# Patient Record
Sex: Female | Born: 1940
Health system: Southern US, Community
[De-identification: ages and names within clinical notes are randomized; demographics above are authoritative.]

## PROBLEM LIST (undated history)

## (undated) DIAGNOSIS — R0609 Other forms of dyspnea: Secondary | ICD-10-CM

## (undated) DIAGNOSIS — M5136 Other intervertebral disc degeneration, lumbar region: Secondary | ICD-10-CM

## (undated) DIAGNOSIS — I10 Essential (primary) hypertension: Secondary | ICD-10-CM

## (undated) DIAGNOSIS — M810 Age-related osteoporosis without current pathological fracture: Secondary | ICD-10-CM

## (undated) DIAGNOSIS — K222 Esophageal obstruction: Secondary | ICD-10-CM

## (undated) DIAGNOSIS — J309 Allergic rhinitis, unspecified: Secondary | ICD-10-CM

## (undated) DIAGNOSIS — I4891 Unspecified atrial fibrillation: Secondary | ICD-10-CM

## (undated) DIAGNOSIS — M1990 Unspecified osteoarthritis, unspecified site: Secondary | ICD-10-CM

## (undated) DIAGNOSIS — R7303 Prediabetes: Secondary | ICD-10-CM

## (undated) DIAGNOSIS — Z950 Presence of cardiac pacemaker: Secondary | ICD-10-CM

## (undated) DIAGNOSIS — K219 Gastro-esophageal reflux disease without esophagitis: Secondary | ICD-10-CM

## (undated) DIAGNOSIS — M51369 Other intervertebral disc degeneration, lumbar region without mention of lumbar back pain or lower extremity pain: Secondary | ICD-10-CM

## (undated) DIAGNOSIS — E7800 Pure hypercholesterolemia, unspecified: Secondary | ICD-10-CM

## (undated) HISTORY — DX: Other intervertebral disc degeneration, lumbar region: M51.36

## (undated) HISTORY — DX: Unspecified atrial fibrillation: I48.91

## (undated) HISTORY — DX: Prediabetes: R73.03

## (undated) HISTORY — DX: Allergic rhinitis, unspecified: J30.9

## (undated) HISTORY — DX: Other intervertebral disc degeneration, lumbar region without mention of lumbar back pain or lower extremity pain: M51.369

## (undated) HISTORY — DX: Esophageal obstruction: K22.2

## (undated) HISTORY — DX: Presence of cardiac pacemaker: Z95.0

## (undated) HISTORY — PX: BREAST BIOPSY: SHX20

## (undated) HISTORY — DX: Essential (primary) hypertension: I10

## (undated) HISTORY — DX: Pure hypercholesterolemia, unspecified: E78.00

## (undated) HISTORY — DX: Other forms of dyspnea: R06.09

## (undated) HISTORY — DX: Unspecified osteoarthritis, unspecified site: M19.90

## (undated) HISTORY — DX: Age-related osteoporosis without current pathological fracture: M81.0

## (undated) HISTORY — PX: INSERT / REPLACE / REMOVE PACEMAKER: SUR710

## (undated) HISTORY — DX: Gastro-esophageal reflux disease without esophagitis: K21.9

---

## 2014-10-06 DIAGNOSIS — I10 Essential (primary) hypertension: Secondary | ICD-10-CM | POA: Diagnosis not present

## 2014-10-06 DIAGNOSIS — E780 Pure hypercholesterolemia: Secondary | ICD-10-CM | POA: Diagnosis not present

## 2014-10-15 DIAGNOSIS — R55 Syncope and collapse: Secondary | ICD-10-CM | POA: Diagnosis not present

## 2014-10-15 DIAGNOSIS — E785 Hyperlipidemia, unspecified: Secondary | ICD-10-CM | POA: Diagnosis not present

## 2014-10-15 DIAGNOSIS — I10 Essential (primary) hypertension: Secondary | ICD-10-CM | POA: Diagnosis not present

## 2014-10-15 DIAGNOSIS — I455 Other specified heart block: Secondary | ICD-10-CM | POA: Diagnosis not present

## 2014-10-15 DIAGNOSIS — I495 Sick sinus syndrome: Secondary | ICD-10-CM | POA: Diagnosis not present

## 2014-10-16 DIAGNOSIS — I480 Paroxysmal atrial fibrillation: Secondary | ICD-10-CM | POA: Diagnosis not present

## 2014-10-16 DIAGNOSIS — Z0000 Encounter for general adult medical examination without abnormal findings: Secondary | ICD-10-CM | POA: Diagnosis not present

## 2014-10-16 DIAGNOSIS — K219 Gastro-esophageal reflux disease without esophagitis: Secondary | ICD-10-CM | POA: Diagnosis not present

## 2014-10-16 DIAGNOSIS — Z1239 Encounter for other screening for malignant neoplasm of breast: Secondary | ICD-10-CM | POA: Diagnosis not present

## 2014-10-16 DIAGNOSIS — E780 Pure hypercholesterolemia: Secondary | ICD-10-CM | POA: Diagnosis not present

## 2014-10-16 DIAGNOSIS — I495 Sick sinus syndrome: Secondary | ICD-10-CM | POA: Diagnosis not present

## 2014-10-16 DIAGNOSIS — N6019 Diffuse cystic mastopathy of unspecified breast: Secondary | ICD-10-CM | POA: Diagnosis not present

## 2014-10-16 DIAGNOSIS — I10 Essential (primary) hypertension: Secondary | ICD-10-CM | POA: Diagnosis not present

## 2014-10-16 DIAGNOSIS — M7542 Impingement syndrome of left shoulder: Secondary | ICD-10-CM | POA: Diagnosis not present

## 2014-10-27 DIAGNOSIS — M7542 Impingement syndrome of left shoulder: Secondary | ICD-10-CM | POA: Diagnosis not present

## 2014-10-27 DIAGNOSIS — M25512 Pain in left shoulder: Secondary | ICD-10-CM | POA: Diagnosis not present

## 2014-10-30 DIAGNOSIS — M25512 Pain in left shoulder: Secondary | ICD-10-CM | POA: Diagnosis not present

## 2014-10-30 DIAGNOSIS — M7542 Impingement syndrome of left shoulder: Secondary | ICD-10-CM | POA: Diagnosis not present

## 2014-11-03 DIAGNOSIS — M25512 Pain in left shoulder: Secondary | ICD-10-CM | POA: Diagnosis not present

## 2014-11-03 DIAGNOSIS — M7542 Impingement syndrome of left shoulder: Secondary | ICD-10-CM | POA: Diagnosis not present

## 2014-11-06 DIAGNOSIS — M25512 Pain in left shoulder: Secondary | ICD-10-CM | POA: Diagnosis not present

## 2014-11-06 DIAGNOSIS — M7542 Impingement syndrome of left shoulder: Secondary | ICD-10-CM | POA: Diagnosis not present

## 2014-11-11 DIAGNOSIS — M7542 Impingement syndrome of left shoulder: Secondary | ICD-10-CM | POA: Diagnosis not present

## 2014-11-11 DIAGNOSIS — M25512 Pain in left shoulder: Secondary | ICD-10-CM | POA: Diagnosis not present

## 2014-11-18 DIAGNOSIS — M7542 Impingement syndrome of left shoulder: Secondary | ICD-10-CM | POA: Diagnosis not present

## 2014-11-18 DIAGNOSIS — M25512 Pain in left shoulder: Secondary | ICD-10-CM | POA: Diagnosis not present

## 2014-11-20 DIAGNOSIS — M25512 Pain in left shoulder: Secondary | ICD-10-CM | POA: Diagnosis not present

## 2014-11-20 DIAGNOSIS — M7542 Impingement syndrome of left shoulder: Secondary | ICD-10-CM | POA: Diagnosis not present

## 2014-11-25 DIAGNOSIS — M7542 Impingement syndrome of left shoulder: Secondary | ICD-10-CM | POA: Diagnosis not present

## 2014-11-25 DIAGNOSIS — M25512 Pain in left shoulder: Secondary | ICD-10-CM | POA: Diagnosis not present

## 2014-11-27 DIAGNOSIS — M25512 Pain in left shoulder: Secondary | ICD-10-CM | POA: Diagnosis not present

## 2014-11-27 DIAGNOSIS — M7542 Impingement syndrome of left shoulder: Secondary | ICD-10-CM | POA: Diagnosis not present

## 2014-12-02 DIAGNOSIS — M7542 Impingement syndrome of left shoulder: Secondary | ICD-10-CM | POA: Diagnosis not present

## 2014-12-02 DIAGNOSIS — M25512 Pain in left shoulder: Secondary | ICD-10-CM | POA: Diagnosis not present

## 2014-12-03 DIAGNOSIS — I495 Sick sinus syndrome: Secondary | ICD-10-CM | POA: Diagnosis not present

## 2014-12-03 DIAGNOSIS — I10 Essential (primary) hypertension: Secondary | ICD-10-CM | POA: Diagnosis not present

## 2014-12-03 DIAGNOSIS — R55 Syncope and collapse: Secondary | ICD-10-CM | POA: Diagnosis not present

## 2014-12-03 DIAGNOSIS — I455 Other specified heart block: Secondary | ICD-10-CM | POA: Diagnosis not present

## 2014-12-04 DIAGNOSIS — M7542 Impingement syndrome of left shoulder: Secondary | ICD-10-CM | POA: Diagnosis not present

## 2014-12-04 DIAGNOSIS — M25512 Pain in left shoulder: Secondary | ICD-10-CM | POA: Diagnosis not present

## 2014-12-09 DIAGNOSIS — M25512 Pain in left shoulder: Secondary | ICD-10-CM | POA: Diagnosis not present

## 2014-12-09 DIAGNOSIS — M7542 Impingement syndrome of left shoulder: Secondary | ICD-10-CM | POA: Diagnosis not present

## 2014-12-12 DIAGNOSIS — M7542 Impingement syndrome of left shoulder: Secondary | ICD-10-CM | POA: Diagnosis not present

## 2014-12-12 DIAGNOSIS — M25512 Pain in left shoulder: Secondary | ICD-10-CM | POA: Diagnosis not present

## 2014-12-15 DIAGNOSIS — Z1231 Encounter for screening mammogram for malignant neoplasm of breast: Secondary | ICD-10-CM | POA: Diagnosis not present

## 2014-12-16 DIAGNOSIS — M7542 Impingement syndrome of left shoulder: Secondary | ICD-10-CM | POA: Diagnosis not present

## 2014-12-16 DIAGNOSIS — M25512 Pain in left shoulder: Secondary | ICD-10-CM | POA: Diagnosis not present

## 2014-12-18 DIAGNOSIS — M25512 Pain in left shoulder: Secondary | ICD-10-CM | POA: Diagnosis not present

## 2014-12-18 DIAGNOSIS — M7542 Impingement syndrome of left shoulder: Secondary | ICD-10-CM | POA: Diagnosis not present

## 2014-12-23 DIAGNOSIS — M7542 Impingement syndrome of left shoulder: Secondary | ICD-10-CM | POA: Diagnosis not present

## 2014-12-23 DIAGNOSIS — M25512 Pain in left shoulder: Secondary | ICD-10-CM | POA: Diagnosis not present

## 2014-12-25 DIAGNOSIS — M7542 Impingement syndrome of left shoulder: Secondary | ICD-10-CM | POA: Diagnosis not present

## 2014-12-25 DIAGNOSIS — M25512 Pain in left shoulder: Secondary | ICD-10-CM | POA: Diagnosis not present

## 2015-01-01 DIAGNOSIS — M7542 Impingement syndrome of left shoulder: Secondary | ICD-10-CM | POA: Diagnosis not present

## 2015-01-01 DIAGNOSIS — M25512 Pain in left shoulder: Secondary | ICD-10-CM | POA: Diagnosis not present

## 2015-01-07 DIAGNOSIS — M7542 Impingement syndrome of left shoulder: Secondary | ICD-10-CM | POA: Diagnosis not present

## 2015-01-07 DIAGNOSIS — M25512 Pain in left shoulder: Secondary | ICD-10-CM | POA: Diagnosis not present

## 2015-01-08 DIAGNOSIS — M7542 Impingement syndrome of left shoulder: Secondary | ICD-10-CM | POA: Diagnosis not present

## 2015-01-08 DIAGNOSIS — M25512 Pain in left shoulder: Secondary | ICD-10-CM | POA: Diagnosis not present

## 2015-01-13 DIAGNOSIS — M7542 Impingement syndrome of left shoulder: Secondary | ICD-10-CM | POA: Diagnosis not present

## 2015-01-13 DIAGNOSIS — M25512 Pain in left shoulder: Secondary | ICD-10-CM | POA: Diagnosis not present

## 2015-01-15 DIAGNOSIS — M7542 Impingement syndrome of left shoulder: Secondary | ICD-10-CM | POA: Diagnosis not present

## 2015-01-15 DIAGNOSIS — M25512 Pain in left shoulder: Secondary | ICD-10-CM | POA: Diagnosis not present

## 2015-02-12 DIAGNOSIS — H26491 Other secondary cataract, right eye: Secondary | ICD-10-CM | POA: Diagnosis not present

## 2015-02-12 DIAGNOSIS — Z961 Presence of intraocular lens: Secondary | ICD-10-CM | POA: Diagnosis not present

## 2015-02-12 DIAGNOSIS — H40051 Ocular hypertension, right eye: Secondary | ICD-10-CM | POA: Diagnosis not present

## 2015-04-15 DIAGNOSIS — R55 Syncope and collapse: Secondary | ICD-10-CM | POA: Diagnosis not present

## 2015-04-15 DIAGNOSIS — I495 Sick sinus syndrome: Secondary | ICD-10-CM | POA: Diagnosis not present

## 2015-04-15 DIAGNOSIS — E785 Hyperlipidemia, unspecified: Secondary | ICD-10-CM | POA: Diagnosis not present

## 2015-04-15 DIAGNOSIS — I441 Atrioventricular block, second degree: Secondary | ICD-10-CM | POA: Diagnosis not present

## 2015-04-15 DIAGNOSIS — E119 Type 2 diabetes mellitus without complications: Secondary | ICD-10-CM | POA: Diagnosis not present

## 2015-05-23 DIAGNOSIS — Z23 Encounter for immunization: Secondary | ICD-10-CM | POA: Diagnosis not present

## 2015-06-03 DIAGNOSIS — I10 Essential (primary) hypertension: Secondary | ICD-10-CM | POA: Diagnosis not present

## 2015-06-03 DIAGNOSIS — E785 Hyperlipidemia, unspecified: Secondary | ICD-10-CM | POA: Diagnosis not present

## 2015-08-19 DIAGNOSIS — Z961 Presence of intraocular lens: Secondary | ICD-10-CM | POA: Diagnosis not present

## 2015-08-19 DIAGNOSIS — H40051 Ocular hypertension, right eye: Secondary | ICD-10-CM | POA: Diagnosis not present

## 2015-08-19 DIAGNOSIS — H26491 Other secondary cataract, right eye: Secondary | ICD-10-CM | POA: Diagnosis not present

## 2015-10-19 DIAGNOSIS — R5383 Other fatigue: Secondary | ICD-10-CM | POA: Diagnosis not present

## 2015-10-19 DIAGNOSIS — I480 Paroxysmal atrial fibrillation: Secondary | ICD-10-CM | POA: Diagnosis not present

## 2015-10-19 DIAGNOSIS — I10 Essential (primary) hypertension: Secondary | ICD-10-CM | POA: Diagnosis not present

## 2015-10-19 DIAGNOSIS — M791 Myalgia: Secondary | ICD-10-CM | POA: Diagnosis not present

## 2015-10-19 DIAGNOSIS — Z1239 Encounter for other screening for malignant neoplasm of breast: Secondary | ICD-10-CM | POA: Diagnosis not present

## 2015-10-19 DIAGNOSIS — Z0000 Encounter for general adult medical examination without abnormal findings: Secondary | ICD-10-CM | POA: Diagnosis not present

## 2015-10-19 DIAGNOSIS — Z23 Encounter for immunization: Secondary | ICD-10-CM | POA: Diagnosis not present

## 2015-10-19 DIAGNOSIS — I495 Sick sinus syndrome: Secondary | ICD-10-CM | POA: Diagnosis not present

## 2015-10-19 DIAGNOSIS — E7800 Pure hypercholesterolemia, unspecified: Secondary | ICD-10-CM | POA: Diagnosis not present

## 2015-10-19 DIAGNOSIS — H6123 Impacted cerumen, bilateral: Secondary | ICD-10-CM | POA: Diagnosis not present

## 2015-12-02 DIAGNOSIS — R030 Elevated blood-pressure reading, without diagnosis of hypertension: Secondary | ICD-10-CM | POA: Diagnosis not present

## 2015-12-02 DIAGNOSIS — I495 Sick sinus syndrome: Secondary | ICD-10-CM | POA: Diagnosis not present

## 2015-12-02 DIAGNOSIS — E785 Hyperlipidemia, unspecified: Secondary | ICD-10-CM | POA: Diagnosis not present

## 2015-12-03 DIAGNOSIS — E784 Other hyperlipidemia: Secondary | ICD-10-CM | POA: Diagnosis not present

## 2015-12-03 DIAGNOSIS — R030 Elevated blood-pressure reading, without diagnosis of hypertension: Secondary | ICD-10-CM | POA: Diagnosis not present

## 2015-12-21 DIAGNOSIS — Z1231 Encounter for screening mammogram for malignant neoplasm of breast: Secondary | ICD-10-CM | POA: Diagnosis not present

## 2015-12-23 DIAGNOSIS — I495 Sick sinus syndrome: Secondary | ICD-10-CM | POA: Diagnosis not present

## 2015-12-23 DIAGNOSIS — R030 Elevated blood-pressure reading, without diagnosis of hypertension: Secondary | ICD-10-CM | POA: Diagnosis not present

## 2015-12-23 DIAGNOSIS — I441 Atrioventricular block, second degree: Secondary | ICD-10-CM | POA: Diagnosis not present

## 2015-12-23 DIAGNOSIS — R55 Syncope and collapse: Secondary | ICD-10-CM | POA: Diagnosis not present

## 2015-12-23 DIAGNOSIS — E785 Hyperlipidemia, unspecified: Secondary | ICD-10-CM | POA: Diagnosis not present

## 2015-12-23 DIAGNOSIS — I4891 Unspecified atrial fibrillation: Secondary | ICD-10-CM | POA: Diagnosis not present

## 2016-02-29 DIAGNOSIS — E785 Hyperlipidemia, unspecified: Secondary | ICD-10-CM | POA: Diagnosis not present

## 2016-02-29 DIAGNOSIS — R55 Syncope and collapse: Secondary | ICD-10-CM | POA: Diagnosis not present

## 2016-02-29 DIAGNOSIS — I495 Sick sinus syndrome: Secondary | ICD-10-CM | POA: Diagnosis not present

## 2016-02-29 DIAGNOSIS — R030 Elevated blood-pressure reading, without diagnosis of hypertension: Secondary | ICD-10-CM | POA: Diagnosis not present

## 2016-02-29 DIAGNOSIS — I441 Atrioventricular block, second degree: Secondary | ICD-10-CM | POA: Diagnosis not present

## 2016-03-03 DIAGNOSIS — Z961 Presence of intraocular lens: Secondary | ICD-10-CM | POA: Diagnosis not present

## 2016-03-03 DIAGNOSIS — H26491 Other secondary cataract, right eye: Secondary | ICD-10-CM | POA: Diagnosis not present

## 2016-03-03 DIAGNOSIS — H40051 Ocular hypertension, right eye: Secondary | ICD-10-CM | POA: Diagnosis not present

## 2016-03-23 DIAGNOSIS — R9431 Abnormal electrocardiogram [ECG] [EKG]: Secondary | ICD-10-CM | POA: Diagnosis not present

## 2016-03-23 DIAGNOSIS — R938 Abnormal findings on diagnostic imaging of other specified body structures: Secondary | ICD-10-CM | POA: Diagnosis not present

## 2016-03-23 DIAGNOSIS — R0602 Shortness of breath: Secondary | ICD-10-CM | POA: Diagnosis not present

## 2016-03-25 DIAGNOSIS — R9431 Abnormal electrocardiogram [ECG] [EKG]: Secondary | ICD-10-CM | POA: Diagnosis not present

## 2016-03-25 DIAGNOSIS — Z6825 Body mass index (BMI) 25.0-25.9, adult: Secondary | ICD-10-CM | POA: Diagnosis not present

## 2016-03-25 DIAGNOSIS — M26609 Unspecified temporomandibular joint disorder, unspecified side: Secondary | ICD-10-CM | POA: Diagnosis not present

## 2016-03-25 DIAGNOSIS — I10 Essential (primary) hypertension: Secondary | ICD-10-CM | POA: Diagnosis not present

## 2016-03-30 DIAGNOSIS — I495 Sick sinus syndrome: Secondary | ICD-10-CM | POA: Diagnosis not present

## 2016-03-30 DIAGNOSIS — R030 Elevated blood-pressure reading, without diagnosis of hypertension: Secondary | ICD-10-CM | POA: Diagnosis not present

## 2016-03-30 DIAGNOSIS — R55 Syncope and collapse: Secondary | ICD-10-CM | POA: Diagnosis not present

## 2016-03-30 DIAGNOSIS — E785 Hyperlipidemia, unspecified: Secondary | ICD-10-CM | POA: Diagnosis not present

## 2016-03-30 DIAGNOSIS — I441 Atrioventricular block, second degree: Secondary | ICD-10-CM | POA: Diagnosis not present

## 2016-05-10 DIAGNOSIS — R030 Elevated blood-pressure reading, without diagnosis of hypertension: Secondary | ICD-10-CM | POA: Diagnosis not present

## 2016-05-10 DIAGNOSIS — R55 Syncope and collapse: Secondary | ICD-10-CM | POA: Diagnosis not present

## 2016-05-10 DIAGNOSIS — I441 Atrioventricular block, second degree: Secondary | ICD-10-CM | POA: Diagnosis not present

## 2016-05-10 DIAGNOSIS — I495 Sick sinus syndrome: Secondary | ICD-10-CM | POA: Diagnosis not present

## 2016-05-19 DIAGNOSIS — I4891 Unspecified atrial fibrillation: Secondary | ICD-10-CM | POA: Diagnosis not present

## 2016-05-19 DIAGNOSIS — E785 Hyperlipidemia, unspecified: Secondary | ICD-10-CM | POA: Diagnosis not present

## 2016-05-19 DIAGNOSIS — Z950 Presence of cardiac pacemaker: Secondary | ICD-10-CM | POA: Diagnosis not present

## 2016-05-19 DIAGNOSIS — I441 Atrioventricular block, second degree: Secondary | ICD-10-CM | POA: Diagnosis not present

## 2016-05-19 DIAGNOSIS — I2510 Atherosclerotic heart disease of native coronary artery without angina pectoris: Secondary | ICD-10-CM | POA: Diagnosis not present

## 2016-05-19 DIAGNOSIS — I10 Essential (primary) hypertension: Secondary | ICD-10-CM | POA: Diagnosis not present

## 2016-05-26 DIAGNOSIS — E785 Hyperlipidemia, unspecified: Secondary | ICD-10-CM | POA: Diagnosis not present

## 2016-05-26 DIAGNOSIS — R030 Elevated blood-pressure reading, without diagnosis of hypertension: Secondary | ICD-10-CM | POA: Diagnosis not present

## 2016-05-26 DIAGNOSIS — R0600 Dyspnea, unspecified: Secondary | ICD-10-CM | POA: Diagnosis not present

## 2016-05-26 DIAGNOSIS — I495 Sick sinus syndrome: Secondary | ICD-10-CM | POA: Diagnosis not present

## 2016-05-26 DIAGNOSIS — I480 Paroxysmal atrial fibrillation: Secondary | ICD-10-CM | POA: Diagnosis not present

## 2016-05-26 DIAGNOSIS — I441 Atrioventricular block, second degree: Secondary | ICD-10-CM | POA: Diagnosis not present

## 2016-06-01 DIAGNOSIS — I2510 Atherosclerotic heart disease of native coronary artery without angina pectoris: Secondary | ICD-10-CM | POA: Diagnosis not present

## 2016-06-01 DIAGNOSIS — R030 Elevated blood-pressure reading, without diagnosis of hypertension: Secondary | ICD-10-CM | POA: Diagnosis not present

## 2016-06-01 DIAGNOSIS — I4891 Unspecified atrial fibrillation: Secondary | ICD-10-CM | POA: Diagnosis not present

## 2016-06-06 DIAGNOSIS — Z23 Encounter for immunization: Secondary | ICD-10-CM | POA: Diagnosis not present

## 2016-08-17 DIAGNOSIS — H40003 Preglaucoma, unspecified, bilateral: Secondary | ICD-10-CM | POA: Diagnosis not present

## 2016-09-28 DIAGNOSIS — M25562 Pain in left knee: Secondary | ICD-10-CM | POA: Diagnosis not present

## 2016-10-05 DIAGNOSIS — H40003 Preglaucoma, unspecified, bilateral: Secondary | ICD-10-CM | POA: Diagnosis not present

## 2016-10-14 ENCOUNTER — Ambulatory Visit (INDEPENDENT_AMBULATORY_CARE_PROVIDER_SITE_OTHER): Payer: Medicare Other | Admitting: Internal Medicine

## 2016-10-14 ENCOUNTER — Encounter (INDEPENDENT_AMBULATORY_CARE_PROVIDER_SITE_OTHER): Payer: Self-pay

## 2016-10-14 ENCOUNTER — Encounter: Payer: Self-pay | Admitting: Internal Medicine

## 2016-10-14 VITALS — BP 124/64 | HR 70 | Ht 68.0 in | Wt 162.0 lb

## 2016-10-14 DIAGNOSIS — R001 Bradycardia, unspecified: Secondary | ICD-10-CM

## 2016-10-14 NOTE — Patient Instructions (Signed)

## 2016-10-14 NOTE — Progress Notes (Signed)
ELECTROPHYSIOLOGY CONSULT NOTE  Patient ID: Bianca Contreras, MRN: [id number], DOB/AGE: 76/01/1941 76 y.o. Admit date: (Not on file) Date of Consult: 10/14/2016  Primary Physician: No primary care provider on file. Primary Cardiologist: new  Consulting Physician new  Chief Complaint: to establish   HPI Bianca Contreras is a 76 y.o. female  With his history of syncope in 2015 that prompted the implantation of a dual-chamber [employer].  The triggering event occurred at a restaurant. She had driven with her husband from church to [employer]. There was no air conditioning in the car. Was quite hot. She sat at the table with her husband and her son and daughter-in-law. She had some sense of impending issues and then lost consciousness leaning against either her husband or the wall. EMS was called. I read the consultation note from the attending cardiologist who describes 2 further episodes of syncope after arrival in the emergency room 1 associated with micturition the other not. The latter had associated heart rate of 42. His question was whether with vasovagal syncope. She socially underwent pacing. Her cardiac evaluation also included a calcium score which was abnormal and she underwent cardiac catheterization demonstrating nonobstructive coronary disease. Echocardiogram was normal  She's had no further syncope.  She had a remote episode of syncope 30 or so years before which she recognized at the time of the more recent event.  She denies problems with exercise tolerance although she does have some shortness of breath and periodic. Her diet is quite replete of sodium      History reviewed. No pertinent past medical history.    Surgical History:  Past Surgical History:  Procedure Laterality Date  . INSERT / REPLACE / REMOVE PACEMAKER       Home Meds: Prior to Admission medications   Medication Sig Start Date End Date Taking? Authorizing Provider  amLODipine  (NORVASC) 2.5 MG tablet Take 1 tablet by mouth daily. 08/12/16  Yes Historical Provider, MD  Calcium Carb-Cholecalciferol (CALCIUM-VITAMIN D) 500-200 MG-UNIT tablet Take 1 tablet by mouth daily.   Yes Historical Provider, MD  ELIQUIS 5 MG TABS tablet Take 1 tablet by mouth 2 (two) times daily. 08/03/16  Yes Historical Provider, MD  fluticasone (FLONASE) 50 MCG/ACT nasal spray Place 2 sprays into both nostrils daily as needed for allergies or rhinitis.   Yes Historical Provider, MD  metoprolol succinate (TOPROL-XL) 50 MG 24 hr tablet Take 50 mg by mouth daily. Take with or immediately following a meal.   Yes Historical Provider, MD  mometasone (ELOCON) 0.1 % cream Apply 1 application topically daily as needed (for dry skin).   Yes Historical Provider, MD  omeprazole (PRILOSEC) 20 MG capsule Take 1 capsule by mouth daily. 08/18/16  Yes Historical Provider, MD  rosuvastatin (CRESTOR) 5 MG tablet Take 1 tablet by mouth daily. 08/12/16  Yes Historical Provider, MD    Allergies: No Known Allergies  Social History   Social History  . Marital status: Married    Spouse name: N/A  . Number of children: N/A  . Years of education: N/A   Occupational History  . Not on file.   Social History Main Topics  . Smoking status: Former Research scientist (life sciences)  . Smokeless tobacco: Never Used  . Alcohol use Yes     Comment: occasionally/socially  . Drug use: No  . Sexual activity: Yes   Other Topics Concern  . Not on file   Social History Narrative  . No narrative on file  Family History  Problem Relation Age of Onset  . Heart attack Mother      ROS:  Please see the history of present illness.     All other systems reviewed and negative.    Physical Exam: Blood pressure 124/64, pulse 70, height 5\' 8"  (1.727 m), weight 162 lb (73.5 kg), SpO2 98 %. General: Well developed, well nourished female in no acute distress. Head: Normocephalic, atraumatic, sclera non-icteric, no xanthomas, nares are without  discharge. EENT: normal  Lymph Nodes:  none Neck: Negative for carotid bruits. JVD not elevated. Back:without scoliosis kyphosis Lungs: Clear bilaterally to auscultation without wheezes, rales, or rhonchi. Breathing is unlabored. Heart: RRR with S1 S2. No  murmur . No rubs, or gallops appreciated. Abdomen: Soft, non-tender, non-distended with normoactive bowel sounds. No hepatomegaly. No rebound/guarding. No obvious abdominal masses. Msk:  Strength and tone appear normal for age. Extremities: No clubbing or cyanosis. No  edema.  Distal pedal pulses are 2+ and equal bilaterally. Skin: Warm and Dry Neuro: Alert and oriented X 3. CN III-XII intact Grossly normal sensory and motor function . Psych:  Responds to questions appropriately with a normal affect.      Labs: Cardiac Enzymes No results for input(s): CKTOTAL, CKMB, TROPONINI in the last 72 hours. CBC No results found for: WBC, HGB, HCT, MCV, PLT PROTIME: No results for input(s): LABPROT, INR in the last 72 hours. Chemistry No results for input(s): NA, K, CL, CO2, BUN, CREATININE, CALCIUM, PROT, BILITOT, ALKPHOS, ALT, AST, GLUCOSE in the last 168 hours.  Invalid input(s): LABALBU Lipids No results found for: CHOL, HDL, LDLCALC, TRIG BNP No results found for: PROBNP Thyroid Function Tests: No results for input(s): TSH, T4TOTAL, T3FREE, THYROIDAB in the last 72 hours.  Invalid input(s): FREET3 Miscellaneous No results found for: DDIMER  Radiology/Studies:  No results found.  EKG:  Sinus at 70 Intervals 15/08/39 A   Assessment and Plan:   Syncope-probably neurally mediated  Dyspnea on exertion  Pacemaker-[employer] Jude    The patient has recurrent syncope that prompted pacemaker implantation because of heartblock. However, it was n at her heart rates were 40s at the time she was single. She had a prodrome that she recognizes 30 or 40 years before. I suspect as did the consulting physician that this represented neurally  mediated syncope.  She has pacemaker mediated tachycardia. We'll reprogram her device DDIR.   We have discussed the physiology of neurally mediated syncope and I haved her to be a 2 to the recurrence of the prodrome and to be aware that the pacemaker may be insufficient to prevent her symptoms    Virl Axe

## 2016-10-19 DIAGNOSIS — K635 Polyp of colon: Secondary | ICD-10-CM | POA: Diagnosis not present

## 2016-10-19 DIAGNOSIS — E7800 Pure hypercholesterolemia, unspecified: Secondary | ICD-10-CM | POA: Diagnosis not present

## 2016-10-19 DIAGNOSIS — Z950 Presence of cardiac pacemaker: Secondary | ICD-10-CM | POA: Diagnosis not present

## 2016-10-19 DIAGNOSIS — I4891 Unspecified atrial fibrillation: Secondary | ICD-10-CM | POA: Diagnosis not present

## 2016-10-19 DIAGNOSIS — J309 Allergic rhinitis, unspecified: Secondary | ICD-10-CM | POA: Diagnosis not present

## 2016-10-19 DIAGNOSIS — K222 Esophageal obstruction: Secondary | ICD-10-CM | POA: Diagnosis not present

## 2016-10-19 DIAGNOSIS — I10 Essential (primary) hypertension: Secondary | ICD-10-CM | POA: Diagnosis not present

## 2016-10-19 DIAGNOSIS — K219 Gastro-esophageal reflux disease without esophagitis: Secondary | ICD-10-CM | POA: Diagnosis not present

## 2016-10-19 DIAGNOSIS — Z1389 Encounter for screening for other disorder: Secondary | ICD-10-CM | POA: Diagnosis not present

## 2016-10-24 ENCOUNTER — Other Ambulatory Visit: Payer: Self-pay | Admitting: Internal Medicine

## 2016-10-24 DIAGNOSIS — R142 Eructation: Secondary | ICD-10-CM | POA: Diagnosis not present

## 2016-10-24 DIAGNOSIS — R0609 Other forms of dyspnea: Secondary | ICD-10-CM | POA: Diagnosis not present

## 2016-10-28 LAB — CUP PACEART INCLINIC DEVICE CHECK
Brady Statistic RA Percent Paced: 6 %
Brady Statistic RV Percent Paced: 1 % — CL
Implantable Lead Location: 753860
Implantable Lead Model: 4135
Implantable Lead Model: 4136
Implantable Lead Serial Number: 29591923
Lead Channel Impedance Value: 481 Ohm
Lead Channel Impedance Value: 645 Ohm
Lead Channel Pacing Threshold Pulse Width: 0.4 ms
Lead Channel Sensing Intrinsic Amplitude: 3.8 mV
Lead Channel Setting Pacing Amplitude: 1.5 V
Lead Channel Setting Pacing Amplitude: 2 V
MDC IDC LEAD IMPLANT DT: 20150818
MDC IDC LEAD IMPLANT DT: 20150818
MDC IDC LEAD LOCATION: 753859
MDC IDC LEAD SERIAL: 29563765
MDC IDC MSMT LEADCHNL RA PACING THRESHOLD AMPLITUDE: 0.6 V
MDC IDC MSMT LEADCHNL RA PACING THRESHOLD PULSEWIDTH: 0.4 ms
MDC IDC MSMT LEADCHNL RV PACING THRESHOLD AMPLITUDE: 0.8 V
MDC IDC MSMT LEADCHNL RV SENSING INTR AMPL: 13.7 mV
MDC IDC PG IMPLANT DT: 20150818
MDC IDC PG SERIAL: 702583
MDC IDC SESS DTM: 20180209050000
MDC IDC SET LEADCHNL RV PACING PULSEWIDTH: 0.4 ms
MDC IDC SET LEADCHNL RV SENSING SENSITIVITY: 2.5 mV

## 2016-11-16 DIAGNOSIS — Z23 Encounter for immunization: Secondary | ICD-10-CM | POA: Diagnosis not present

## 2016-11-16 DIAGNOSIS — M1990 Unspecified osteoarthritis, unspecified site: Secondary | ICD-10-CM | POA: Diagnosis not present

## 2016-11-16 DIAGNOSIS — K222 Esophageal obstruction: Secondary | ICD-10-CM | POA: Diagnosis not present

## 2016-11-16 DIAGNOSIS — E7800 Pure hypercholesterolemia, unspecified: Secondary | ICD-10-CM | POA: Diagnosis not present

## 2016-11-16 DIAGNOSIS — R21 Rash and other nonspecific skin eruption: Secondary | ICD-10-CM | POA: Diagnosis not present

## 2016-11-16 DIAGNOSIS — K219 Gastro-esophageal reflux disease without esophagitis: Secondary | ICD-10-CM | POA: Diagnosis not present

## 2016-11-16 DIAGNOSIS — I4891 Unspecified atrial fibrillation: Secondary | ICD-10-CM | POA: Diagnosis not present

## 2016-11-16 DIAGNOSIS — I10 Essential (primary) hypertension: Secondary | ICD-10-CM | POA: Diagnosis not present

## 2016-11-16 DIAGNOSIS — Z0000 Encounter for general adult medical examination without abnormal findings: Secondary | ICD-10-CM | POA: Diagnosis not present

## 2016-11-16 DIAGNOSIS — Z950 Presence of cardiac pacemaker: Secondary | ICD-10-CM | POA: Diagnosis not present

## 2016-11-16 DIAGNOSIS — M8588 Other specified disorders of bone density and structure, other site: Secondary | ICD-10-CM | POA: Diagnosis not present

## 2016-11-16 DIAGNOSIS — J309 Allergic rhinitis, unspecified: Secondary | ICD-10-CM | POA: Diagnosis not present

## 2016-11-17 ENCOUNTER — Other Ambulatory Visit: Payer: Self-pay | Admitting: Internal Medicine

## 2016-11-17 DIAGNOSIS — Z1231 Encounter for screening mammogram for malignant neoplasm of breast: Secondary | ICD-10-CM

## 2016-12-08 DIAGNOSIS — H029 Unspecified disorder of eyelid: Secondary | ICD-10-CM | POA: Diagnosis not present

## 2016-12-09 DIAGNOSIS — D485 Neoplasm of uncertain behavior of skin: Secondary | ICD-10-CM | POA: Diagnosis not present

## 2016-12-09 DIAGNOSIS — L080 Pyoderma: Secondary | ICD-10-CM | POA: Diagnosis not present

## 2016-12-23 DIAGNOSIS — H40003 Preglaucoma, unspecified, bilateral: Secondary | ICD-10-CM | POA: Diagnosis not present

## 2016-12-28 ENCOUNTER — Ambulatory Visit
Admission: RE | Admit: 2016-12-28 | Discharge: 2016-12-28 | Disposition: A | Discharge status: Home / Self Care | Payer: Medicare Other | Source: Ambulatory Visit | Attending: Internal Medicine | Admitting: Internal Medicine

## 2016-12-28 DIAGNOSIS — M8588 Other specified disorders of bone density and structure, other site: Secondary | ICD-10-CM | POA: Diagnosis not present

## 2016-12-28 DIAGNOSIS — Z1231 Encounter for screening mammogram for malignant neoplasm of breast: Secondary | ICD-10-CM

## 2017-01-16 ENCOUNTER — Ambulatory Visit (INDEPENDENT_AMBULATORY_CARE_PROVIDER_SITE_OTHER): Payer: Medicare Other | Admitting: *Deleted

## 2017-01-16 ENCOUNTER — Telehealth: Payer: Self-pay | Admitting: Cardiology

## 2017-01-16 DIAGNOSIS — R001 Bradycardia, unspecified: Secondary | ICD-10-CM | POA: Diagnosis not present

## 2017-01-16 NOTE — Telephone Encounter (Signed)
Spoke with pt and reminded pt of remote transmission that is due today. Pt verbalized understanding.   

## 2017-01-17 ENCOUNTER — Encounter: Payer: Self-pay | Admitting: Cardiology

## 2017-01-17 NOTE — Progress Notes (Signed)
Remote pacemaker transmission.   

## 2017-01-18 LAB — CUP PACEART REMOTE DEVICE CHECK
Battery Remaining Longevity: 78 mo
Battery Remaining Percentage: 100 %
Brady Statistic RV Percent Paced: 0 %
Implantable Lead Location: 753859
Implantable Lead Location: 753860
Implantable Lead Model: 4135
Implantable Lead Serial Number: 29563765
Implantable Pulse Generator Implant Date: 20150818
Lead Channel Impedance Value: 723 Ohm
Lead Channel Pacing Threshold Amplitude: 1.1 V
Lead Channel Pacing Threshold Pulse Width: 0.4 ms
Lead Channel Setting Pacing Amplitude: 2 V
Lead Channel Setting Pacing Pulse Width: 0.4 ms
Lead Channel Setting Sensing Sensitivity: 2.5 mV
MDC IDC LEAD IMPLANT DT: 20150818
MDC IDC LEAD IMPLANT DT: 20150818
MDC IDC LEAD SERIAL: 29591923
MDC IDC MSMT LEADCHNL RA IMPEDANCE VALUE: 479 Ohm
MDC IDC MSMT LEADCHNL RA PACING THRESHOLD AMPLITUDE: 0.4 V
MDC IDC MSMT LEADCHNL RA PACING THRESHOLD PULSEWIDTH: 0.4 ms
MDC IDC PG SERIAL: 702583
MDC IDC SESS DTM: 20180515031900
MDC IDC SET LEADCHNL RV PACING AMPLITUDE: 1.6 V
MDC IDC STAT BRADY RA PERCENT PACED: 5 %

## 2017-03-24 DIAGNOSIS — H40003 Preglaucoma, unspecified, bilateral: Secondary | ICD-10-CM | POA: Diagnosis not present

## 2017-04-17 ENCOUNTER — Ambulatory Visit (INDEPENDENT_AMBULATORY_CARE_PROVIDER_SITE_OTHER): Payer: Medicare Other | Admitting: *Deleted

## 2017-04-17 DIAGNOSIS — R001 Bradycardia, unspecified: Secondary | ICD-10-CM

## 2017-04-18 LAB — CUP PACEART REMOTE DEVICE CHECK
Battery Remaining Longevity: 72 mo
Battery Remaining Percentage: 100 %
Date Time Interrogation Session: 20180813094100
Implantable Lead Implant Date: 20150818
Implantable Lead Location: 753859
Implantable Lead Location: 753860
Implantable Lead Serial Number: 29563765
Implantable Lead Serial Number: 29591923
Implantable Pulse Generator Implant Date: 20150818
Lead Channel Pacing Threshold Amplitude: 1.2 V
Lead Channel Pacing Threshold Pulse Width: 0.4 ms
Lead Channel Setting Pacing Amplitude: 2 V
Lead Channel Setting Pacing Pulse Width: 0.4 ms
Lead Channel Setting Sensing Sensitivity: 2.5 mV
MDC IDC LEAD IMPLANT DT: 20150818
MDC IDC MSMT LEADCHNL RA IMPEDANCE VALUE: 491 Ohm
MDC IDC MSMT LEADCHNL RA PACING THRESHOLD AMPLITUDE: 0.4 V
MDC IDC MSMT LEADCHNL RA PACING THRESHOLD PULSEWIDTH: 0.4 ms
MDC IDC MSMT LEADCHNL RV IMPEDANCE VALUE: 689 Ohm
MDC IDC SET LEADCHNL RV PACING AMPLITUDE: 1.7 V
MDC IDC STAT BRADY RA PERCENT PACED: 5 %
MDC IDC STAT BRADY RV PERCENT PACED: 0 %
Pulse Gen Serial Number: 702583

## 2017-04-18 NOTE — Progress Notes (Signed)
Remote pacemaker check. 

## 2017-04-25 DIAGNOSIS — H02413 Mechanical ptosis of bilateral eyelids: Secondary | ICD-10-CM | POA: Diagnosis not present

## 2017-04-27 ENCOUNTER — Encounter: Payer: Self-pay | Admitting: Cardiology

## 2017-05-17 DIAGNOSIS — K222 Esophageal obstruction: Secondary | ICD-10-CM | POA: Diagnosis not present

## 2017-05-17 DIAGNOSIS — K219 Gastro-esophageal reflux disease without esophagitis: Secondary | ICD-10-CM | POA: Diagnosis not present

## 2017-05-17 DIAGNOSIS — I4891 Unspecified atrial fibrillation: Secondary | ICD-10-CM | POA: Diagnosis not present

## 2017-05-17 DIAGNOSIS — Z950 Presence of cardiac pacemaker: Secondary | ICD-10-CM | POA: Diagnosis not present

## 2017-05-17 DIAGNOSIS — E7800 Pure hypercholesterolemia, unspecified: Secondary | ICD-10-CM | POA: Diagnosis not present

## 2017-05-17 DIAGNOSIS — J309 Allergic rhinitis, unspecified: Secondary | ICD-10-CM | POA: Diagnosis not present

## 2017-05-17 DIAGNOSIS — Z23 Encounter for immunization: Secondary | ICD-10-CM | POA: Diagnosis not present

## 2017-05-17 DIAGNOSIS — I10 Essential (primary) hypertension: Secondary | ICD-10-CM | POA: Diagnosis not present

## 2017-05-17 DIAGNOSIS — L84 Corns and callosities: Secondary | ICD-10-CM | POA: Diagnosis not present

## 2017-05-18 ENCOUNTER — Encounter: Payer: Self-pay | Admitting: Internal Medicine

## 2017-05-18 NOTE — Progress Notes (Signed)
Fax form received from [facility] for requesting the patient hold eliquis for 2 weeks prior to procedure on 05/26/17 for blepharoplasty of both upper lids.  Per Dr. Caryl Comes- ok to hold eliquis for 3 doses prior to the procedure/ 48 hours prior.   Form was faxed back to the office at [phone]. Confirmation received.

## 2017-05-26 DIAGNOSIS — H02413 Mechanical ptosis of bilateral eyelids: Secondary | ICD-10-CM | POA: Diagnosis not present

## 2017-05-26 DIAGNOSIS — H02412 Mechanical ptosis of left eyelid: Secondary | ICD-10-CM | POA: Diagnosis not present

## 2017-05-26 DIAGNOSIS — H02411 Mechanical ptosis of right eyelid: Secondary | ICD-10-CM | POA: Diagnosis not present

## 2017-05-29 ENCOUNTER — Ambulatory Visit (INDEPENDENT_AMBULATORY_CARE_PROVIDER_SITE_OTHER): Payer: Medicare Other | Admitting: Podiatry

## 2017-05-29 ENCOUNTER — Encounter: Payer: Self-pay | Admitting: Podiatry

## 2017-05-29 DIAGNOSIS — M67479 Ganglion, unspecified ankle and foot: Secondary | ICD-10-CM

## 2017-05-29 NOTE — Progress Notes (Signed)
   HPI: Patient is a 76 year old female presenting today as a new patient with a complaint pain to the medial side of the right foot located near the great toe for the past 3 months. There are no modifying factors noted. She has not done anything to treat the area. She is here for further evaluation and treatment.  No past medical history on file.   Physical Exam: General: The patient is alert and oriented x3 in no acute distress.  Dermatology: Benign skin lesion with viscous drainage noted to the right great toe. Skin is warm, dry and supple bilateral lower extremities. Negative for open lesions or macerations.  Vascular: Palpable pedal pulses bilaterally. No edema or erythema noted. Capillary refill within normal limits.  Neurological: Epicritic and protective threshold grossly intact bilaterally.   Musculoskeletal Exam: Range of motion within normal limits to all pedal and ankle joints bilateral. Muscle strength 5/5 in all groups bilateral.      Assessment: - Ganglion cyst right great toe    Plan of Care:  - Patient evaluated. - Excisional debridement of keratoic lesion using a chisel blade was performed without incident.  - Dressed with light dressing. - Recommended antibiotic ointment and Band-Aid daily. - Return to clinic in 3 weeks.    Edrick Kins, DPM [facility]  Dr. Edrick Kins, DPM    [address], [place] [address]                Office [phone]  Fax [phone]

## 2017-05-29 NOTE — Progress Notes (Signed)
   Subjective:    Patient ID: Bianca Contreras, female    DOB: April 28, 1941, 76 y.o.   MRN: [id number]  HPI    Review of Systems  Allergic/Immunologic: Positive for environmental allergies.  All other systems reviewed and are negative.      Objective:   Physical Exam        Assessment & Plan:

## 2017-06-19 ENCOUNTER — Ambulatory Visit (INDEPENDENT_AMBULATORY_CARE_PROVIDER_SITE_OTHER): Payer: Medicare Other | Admitting: Podiatry

## 2017-06-19 DIAGNOSIS — M67479 Ganglion, unspecified ankle and foot: Secondary | ICD-10-CM | POA: Diagnosis not present

## 2017-06-21 NOTE — Progress Notes (Signed)
   HPI: Patient is a 76 year old female presenting today for follow up evaluation of a ganglion cyst to the right great toe. She states the area has improved significantly. She denies any pain. She is here for further evaluation and treatment.  No past medical history on file.   Physical Exam: General: The patient is alert and oriented x3 in no acute distress.  Dermatology: Benign skin lesion with viscous drainage noted to the right great toe. Skin is warm, dry and supple bilateral lower extremities. Negative for open lesions or macerations.  Vascular: Palpable pedal pulses bilaterally. No edema or erythema noted. Capillary refill within normal limits.  Neurological: Epicritic and protective threshold grossly intact bilaterally.   Musculoskeletal Exam: Range of motion within normal limits to all pedal and ankle joints bilateral. Muscle strength 5/5 in all groups bilateral.      Assessment: - Ganglion cyst right great toe    Plan of Care:  - Patient evaluated. - Light debridement of keratoic lesion using a tissue nipper was performed without incident.  - Dressed with light dressing. - Continue antibiotic ointment and Band-Aid daily. - Return to clinic when necessary.    Edrick Kins, DPM [facility]  Dr. Edrick Kins, DPM    [address], [place] [address]                Office [phone]  Fax [phone]

## 2017-07-17 ENCOUNTER — Ambulatory Visit (INDEPENDENT_AMBULATORY_CARE_PROVIDER_SITE_OTHER): Payer: Medicare Other | Admitting: *Deleted

## 2017-07-17 DIAGNOSIS — R001 Bradycardia, unspecified: Secondary | ICD-10-CM | POA: Diagnosis not present

## 2017-07-17 NOTE — Progress Notes (Signed)
Remote pacemaker transmission.   

## 2017-07-21 ENCOUNTER — Encounter: Payer: Self-pay | Admitting: Cardiology

## 2017-08-07 LAB — CUP PACEART REMOTE DEVICE CHECK
Battery Remaining Percentage: 100 %
Date Time Interrogation Session: 20181112104100
Implantable Lead Implant Date: 20150818
Implantable Lead Location: 753860
Implantable Lead Model: 4136
Implantable Lead Serial Number: 29591923
Implantable Pulse Generator Implant Date: 20150818
Lead Channel Impedance Value: 625 Ohm
Lead Channel Pacing Threshold Amplitude: 0.5 V
Lead Channel Pacing Threshold Amplitude: 1 V
Lead Channel Pacing Threshold Pulse Width: 0.4 ms
Lead Channel Pacing Threshold Pulse Width: 0.4 ms
Lead Channel Setting Pacing Pulse Width: 0.4 ms
Lead Channel Setting Sensing Sensitivity: 2.5 mV
MDC IDC LEAD IMPLANT DT: 20150818
MDC IDC LEAD LOCATION: 753859
MDC IDC LEAD SERIAL: 29563765
MDC IDC MSMT BATTERY REMAINING LONGEVITY: 72 mo
MDC IDC MSMT LEADCHNL RA IMPEDANCE VALUE: 489 Ohm
MDC IDC SET LEADCHNL RA PACING AMPLITUDE: 2 V
MDC IDC SET LEADCHNL RV PACING AMPLITUDE: 1.5 V
MDC IDC STAT BRADY RA PERCENT PACED: 4 %
MDC IDC STAT BRADY RV PERCENT PACED: 0 %
Pulse Gen Serial Number: 702583

## 2017-08-23 ENCOUNTER — Ambulatory Visit (INDEPENDENT_AMBULATORY_CARE_PROVIDER_SITE_OTHER): Payer: Medicare Other | Admitting: Podiatry

## 2017-08-23 ENCOUNTER — Encounter: Payer: Self-pay | Admitting: Podiatry

## 2017-08-23 DIAGNOSIS — M67479 Ganglion, unspecified ankle and foot: Secondary | ICD-10-CM

## 2017-08-23 NOTE — Patient Instructions (Signed)
Pre-Operative Instructions  Congratulations, you have decided to take an important step towards improving your quality of life.  You can be assured that the doctors and staff at [facility] will be with you every step of the way.  Here are some important things you should know:  1. Plan to be at the surgery center/hospital at least 1 (one) hour prior to your scheduled time, unless otherwise directed by the surgical center/hospital staff.  You must have a responsible adult accompany you, remain during the surgery and drive you home.  Make sure you have directions to the surgical center/hospital to ensure you arrive on time. 2. If you are having surgery at [facility] or [facility] hospitals, you will need a copy of your medical history and physical form from your family physician within one month prior to the date of surgery. We will give you a form for your primary physician to complete.  3. We make every effort to accommodate the date you request for surgery.  However, there are times where surgery dates or times have to be moved.  We will contact you as soon as possible if a change in schedule is required.   4. No aspirin/ibuprofen for one week before surgery.  If you are on aspirin, any non-steroidal anti-inflammatory medications (Mobic, Aleve, Ibuprofen) should not be taken seven (7) days prior to your surgery.  You make take Tylenol for pain prior to surgery.  5. Medications - If you are taking daily heart and blood pressure medications, seizure, reflux, allergy, asthma, anxiety, pain or diabetes medications, make sure you notify the surgery center/hospital before the day of surgery so they can tell you which medications you should take or avoid the day of surgery. 6. No food or drink after midnight the night before surgery unless directed otherwise by surgical center/hospital staff. 7. No alcoholic beverages 24-hours prior to surgery.  No smoking 24-hours prior or 24-hours after  surgery. 8. Wear loose pants or shorts. They should be loose enough to fit over bandages, boots, and casts. 9. Don't wear slip-on shoes. Sneakers are preferred. 10. Bring your boot with you to the surgery center/hospital.  Also bring crutches or a walker if your physician has prescribed it for you.  If you do not have this equipment, it will be provided for you after surgery. 11. If you have not been contacted by the surgery center/hospital by the day before your surgery, call to confirm the date and time of your surgery. 12. Leave-time from work may vary depending on the type of surgery you have.  Appropriate arrangements should be made prior to surgery with your employer. 13. Prescriptions will be provided immediately following surgery by your doctor.  Fill these as soon as possible after surgery and take the medication as directed. Pain medications will not be refilled on weekends and must be approved by the doctor. 14. Remove nail polish on the operative foot and avoid getting pedicures prior to surgery. 15. Wash the night before surgery.  The night before surgery wash the foot and leg well with water and the antibacterial soap provided. Be sure to pay special attention to beneath the toenails and in between the toes.  Wash for at least three (3) minutes. Rinse thoroughly with water and dry well with a towel.  Perform this wash unless told not to do so by your physician.  Enclosed: 1 Ice pack (please put in freezer the night before surgery)   1 Hibiclens skin cleaner     Pre-op instructions  If you have any questions regarding the instructions, please do not hesitate to call our office.  [place]: [address], [place], [place] [address] -- [phone]  [place]: [address] [place], [place] [address] -- [phone]  [place]: [address]  [place], [place] [address] -- [phone]  [place]: [address], [place], [place] [address] -- [phone]  Website:  [email or web address] 

## 2017-08-30 NOTE — Progress Notes (Signed)
   HPI: Patient is a 76 year old female presenting today for follow up evaluation of a ganglion cyst to the right great toe.  She reports the area is painful to the touch in certain places.  She has been keeping the bandage in place when wearing shoes.  She has no new complaints at this time. She is here for further evaluation and treatment.   No past medical history on file.   Physical Exam: General: The patient is alert and oriented x3 in no acute distress.  Dermatology: Benign skin lesion with viscous drainage noted to the right great toe. Skin is warm, dry and supple bilateral lower extremities. Negative for open lesions or macerations.  Vascular: Palpable pedal pulses bilaterally. No edema or erythema noted. Capillary refill within normal limits.  Neurological: Epicritic and protective threshold grossly intact bilaterally.   Musculoskeletal Exam: Range of motion within normal limits to all pedal and ankle joints bilateral. Muscle strength 5/5 in all groups bilateral.      Assessment: - Ganglion cyst right great toe    Plan of Care:  - Patient evaluated. - Light debridement of keratoic lesion using a tissue nipper was performed without incident.  - Dressed with antibiotic ointment and Band-Aid. - Today we discussed the conservative versus surgical management of the presenting pathology. The patient opts for surgical management. All possible complications and details of the procedure were explained. All patient questions were answered. No guarantees were expressed or implied. - Authorization for surgery was initiated today. Surgery will consist of excision of ganglion cyst right great toe. - Return to clinic 1 week postop.     Edrick Kins, DPM [facility]  Dr. Edrick Kins, DPM    [address], [place] [address]                Office [phone]  Fax [phone]

## 2017-09-20 ENCOUNTER — Ambulatory Visit (INDEPENDENT_AMBULATORY_CARE_PROVIDER_SITE_OTHER): Payer: Medicare Other | Admitting: Podiatry

## 2017-09-20 ENCOUNTER — Encounter: Payer: Self-pay | Admitting: Podiatry

## 2017-09-20 VITALS — BP 132/67 | HR 69 | Temp 96.0°F | Resp 16

## 2017-09-20 DIAGNOSIS — M67479 Ganglion, unspecified ankle and foot: Secondary | ICD-10-CM

## 2017-09-20 NOTE — Progress Notes (Signed)
   HPI: Patient presents this morning for an in office procedure regarding recurrent ganglion cyst to the medial aspect of the right great toe.  Patient states that since last visit the ganglion cyst with ulceration has significantly improved.  She presents this morning for possible excision of ganglion cyst right great toe.  Authorization for surgery was initiated last visit on 08/23/2017.  History reviewed. No pertinent past medical history.   Physical Exam: General: The patient is alert and oriented x3 in no acute distress.  Dermatology: It appears that the recurrent ulceration secondary to a ganglion cyst to the medial aspect of the right great toe has healed.  Complete reepithelialization has occurred.  No open lesions noted.  Skin is dry and supple.  Vascular: Palpable pedal pulses bilaterally. No edema or erythema noted. Capillary refill within normal limits.  Neurological: Epicritic and protective threshold grossly intact bilaterally.   Musculoskeletal Exam: Range of motion within normal limits to all pedal and ankle joints bilateral. Muscle strength 5/5 in all groups bilateral.   Assessment: -Recurrent ganglion cyst medial aspect right great toe at the interphalangeal joint.-Improved   Plan of Care:  -The patient was evaluated this morning.  Today give the patient the option to proceed with the excision of the ganglion cyst of the right great toe or just simply observe the lesion and see if it recurs.  Today it is the best it has ever looked. -The decision was made this morning to simply observe the lesion and see if it recurs.  If it does recur I instructed the patient to call to the office and set up early morning in office procedure to proceed with the excision of the ganglion cyst. -Return to clinic as needed    Edrick Kins, DPM [facility]  Dr. Edrick Kins, DPM    [address], [place] [address]                 Office [phone]  Fax [phone]

## 2017-09-27 ENCOUNTER — Encounter: Payer: Medicare Other | Admitting: Podiatry

## 2017-09-27 DIAGNOSIS — H40003 Preglaucoma, unspecified, bilateral: Secondary | ICD-10-CM | POA: Diagnosis not present

## 2017-10-04 ENCOUNTER — Encounter: Payer: Medicare Other | Admitting: Podiatry

## 2017-10-16 ENCOUNTER — Ambulatory Visit (INDEPENDENT_AMBULATORY_CARE_PROVIDER_SITE_OTHER): Payer: Medicare Other | Admitting: *Deleted

## 2017-10-16 DIAGNOSIS — R001 Bradycardia, unspecified: Secondary | ICD-10-CM | POA: Diagnosis not present

## 2017-10-16 NOTE — Progress Notes (Signed)
Remote pacemaker transmission.   

## 2017-10-18 ENCOUNTER — Encounter: Payer: Self-pay | Admitting: Cardiology

## 2017-10-24 LAB — CUP PACEART REMOTE DEVICE CHECK
Brady Statistic RV Percent Paced: 0 %
Date Time Interrogation Session: 20190211105400
Implantable Lead Implant Date: 20150818
Implantable Lead Location: 753860
Implantable Lead Model: 4135
Implantable Lead Model: 4136
Implantable Lead Serial Number: 29563765
Lead Channel Impedance Value: 486 Ohm
Lead Channel Impedance Value: 583 Ohm
Lead Channel Pacing Threshold Amplitude: 0.5 V
Lead Channel Pacing Threshold Amplitude: 1 V
Lead Channel Setting Pacing Amplitude: 2 V
MDC IDC LEAD IMPLANT DT: 20150818
MDC IDC LEAD LOCATION: 753859
MDC IDC LEAD SERIAL: 29591923
MDC IDC MSMT BATTERY REMAINING LONGEVITY: 66 mo
MDC IDC MSMT BATTERY REMAINING PERCENTAGE: 100 %
MDC IDC MSMT LEADCHNL RA PACING THRESHOLD PULSEWIDTH: 0.4 ms
MDC IDC MSMT LEADCHNL RV PACING THRESHOLD PULSEWIDTH: 0.4 ms
MDC IDC PG IMPLANT DT: 20150818
MDC IDC PG SERIAL: 702583
MDC IDC SET LEADCHNL RV PACING AMPLITUDE: 1.5 V
MDC IDC SET LEADCHNL RV PACING PULSEWIDTH: 0.4 ms
MDC IDC SET LEADCHNL RV SENSING SENSITIVITY: 2.5 mV
MDC IDC STAT BRADY RA PERCENT PACED: 4 %

## 2017-11-01 ENCOUNTER — Encounter: Payer: Self-pay | Admitting: Internal Medicine

## 2017-11-01 ENCOUNTER — Ambulatory Visit (INDEPENDENT_AMBULATORY_CARE_PROVIDER_SITE_OTHER): Payer: Medicare Other | Admitting: Internal Medicine

## 2017-11-01 VITALS — BP 122/70 | HR 68 | Ht 68.0 in | Wt 165.8 lb

## 2017-11-01 DIAGNOSIS — R001 Bradycardia, unspecified: Secondary | ICD-10-CM | POA: Diagnosis not present

## 2017-11-01 DIAGNOSIS — Z950 Presence of cardiac pacemaker: Secondary | ICD-10-CM

## 2017-11-01 NOTE — Patient Instructions (Addendum)
Medication Instructions:  Your physician recommends that you continue on your current medications as directed. Please refer to the Current Medication list given to you today.  Labwork:  BMP and CBC  Testing/Procedures: None ordered.  Follow-Up: Your physician recommends that you schedule a follow-up appointment in: One Year with Dr Caryl Comes  Remote monitoring is used to monitor your Pacemaker from home. This monitoring reduces the number of office visits required to check your device to one time per year. It allows [place] to keep an eye on the functioning of your device to ensure it is working properly. You are scheduled for a device check from home on 01/15/2018. You may send your transmission at any time that day. If you have a wireless device, the transmission will be sent automatically. After your physician reviews your transmission, you will receive a postcard with your next transmission date.  Any Other Special Instructions Will Be Listed Below (If Applicable).     If you need a refill on your cardiac medications before your next appointment, please call your pharmacy.

## 2017-11-01 NOTE — Progress Notes (Signed)
      Patient Care Team: Wenda Low, MD as PCP - General (Internal Medicine)   HPI  Bianca Contreras is a 77 y.o. female Seen in follow-up for pacemaker implanted 2015 elsewhere-[employer].  She had a history of remote syncope but has had none since.  My reading of the notes suggested that it may well have been neurally mediated.  Cardiac evaluation 2015 included a calcium score that was abnormal catheterization that was nonobstructive.  Echocardiogram normal She has had no interval syncope.  She denies chest pain or shortness of breath. Records and Results Reviewed   History reviewed. No pertinent past medical history.  Past Surgical History:  Procedure Laterality Date  . BREAST BIOPSY     25 years ago cyst biopsy  . INSERT / REPLACE / REMOVE PACEMAKER      Current Outpatient Medications  Medication Sig Dispense Refill  . amLODipine (NORVASC) 2.5 MG tablet Take 1 tablet by mouth daily.    . Ascorbic Acid (VITAMIN C) 1000 MG tablet Take 1,000 mg by mouth daily.    . Calcium Carb-Cholecalciferol (CALCIUM-VITAMIN D) 500-200 MG-UNIT tablet Take 1 tablet by mouth daily.    Marland Kitchen ELIQUIS 5 MG TABS tablet Take 1 tablet by mouth 2 (two) times daily.    . fluticasone (FLONASE) 50 MCG/ACT nasal spray Place 2 sprays into both nostrils daily as needed for allergies or rhinitis.    . metoprolol succinate (TOPROL-XL) 50 MG 24 hr tablet Take 50 mg by mouth daily. Take with or immediately following a meal.    . metoprolol tartrate (LOPRESSOR) 50 MG tablet Take 1 tablet by mouth 2 (two) times daily.    . mometasone (ELOCON) 0.1 % cream Apply 1 application topically daily as needed (for dry skin).    Marland Kitchen omeprazole (PRILOSEC) 20 MG capsule Take 1 capsule by mouth 2 (two) times daily.     . rosuvastatin (CRESTOR) 5 MG tablet Take 1 tablet by mouth daily.    . vitamin B-12 (CYANOCOBALAMIN) 1000 MCG tablet Take 1,000 mcg by mouth daily.     No current facility-administered medications for  this visit.     No Known Allergies    Review of Systems negative except from HPI and PMH  Physical Exam BP 122/70   Pulse 68   Ht 5\' 8"  (1.727 m)   Wt 165 lb 12.8 oz (75.2 kg)   SpO2 95%   BMI 25.21 kg/m  Well developed and nourished in no acute distress HENT normal Neck supple with JVP-flat Clear Regular rate and rhythm, no murmurs or gallops Abd-soft with active BS No Clubbing cyanosis edema Skin-warm and dry A & Oriented  Grossly normal sensory and motor function     Assessment and  Plan  Syncope-probably neurally mediated  Dyspnea on exertion  Pacemaker-[employer]  Overall stable  No syncope  No heart failure   Current medicines are reviewed at length with the patient today .  The patient does not  have concerns regarding medicines.

## 2017-11-02 LAB — CBC WITH DIFFERENTIAL/PLATELET
BASOS: 1 %
Basophils Absolute: 0 10*3/uL (ref 0.0–0.2)
EOS (ABSOLUTE): 0.1 10*3/uL (ref 0.0–0.4)
EOS: 2 %
HEMATOCRIT: 38.8 % (ref 34.0–46.6)
HEMOGLOBIN: 12.4 g/dL (ref 11.1–15.9)
IMMATURE GRANS (ABS): 0 10*3/uL (ref 0.0–0.1)
IMMATURE GRANULOCYTES: 0 %
LYMPHS: 36 %
Lymphocytes Absolute: 1.4 10*3/uL (ref 0.7–3.1)
MCH: 28.2 pg (ref 26.6–33.0)
MCHC: 32 g/dL (ref 31.5–35.7)
MCV: 88 fL (ref 79–97)
Monocytes Absolute: 0.9 10*3/uL (ref 0.1–0.9)
Monocytes: 22 %
NEUTROS ABS: 1.6 10*3/uL (ref 1.4–7.0)
NEUTROS PCT: 39 %
Platelets: 287 10*3/uL (ref 150–379)
RBC: 4.39 x10E6/uL (ref 3.77–5.28)
RDW: 14.7 % (ref 12.3–15.4)
WBC: 3.9 10*3/uL (ref 3.4–10.8)

## 2017-11-02 LAB — BASIC METABOLIC PANEL
BUN/Creatinine Ratio: 12 (ref 12–28)
BUN: 11 mg/dL (ref 8–27)
CALCIUM: 9.2 mg/dL (ref 8.7–10.3)
CO2: 23 mmol/L (ref 20–29)
CREATININE: 0.95 mg/dL (ref 0.57–1.00)
Chloride: 102 mmol/L (ref 96–106)
GFR calc Af Amer: 67 mL/min/{1.73_m2} (ref >59)
GFR, EST NON AFRICAN AMERICAN: 58 mL/min/{1.73_m2} — AB (ref >59)
Glucose: 96 mg/dL (ref 65–99)
POTASSIUM: 3.8 mmol/L (ref 3.5–5.2)
Sodium: 143 mmol/L (ref 134–144)

## 2017-11-20 ENCOUNTER — Other Ambulatory Visit: Payer: Self-pay | Admitting: Internal Medicine

## 2017-11-20 DIAGNOSIS — Z139 Encounter for screening, unspecified: Secondary | ICD-10-CM

## 2017-11-22 DIAGNOSIS — Z7189 Other specified counseling: Secondary | ICD-10-CM | POA: Diagnosis not present

## 2017-11-22 DIAGNOSIS — Z1389 Encounter for screening for other disorder: Secondary | ICD-10-CM | POA: Diagnosis not present

## 2017-11-22 DIAGNOSIS — E7800 Pure hypercholesterolemia, unspecified: Secondary | ICD-10-CM | POA: Diagnosis not present

## 2017-11-22 DIAGNOSIS — I4891 Unspecified atrial fibrillation: Secondary | ICD-10-CM | POA: Diagnosis not present

## 2017-11-22 DIAGNOSIS — R7309 Other abnormal glucose: Secondary | ICD-10-CM | POA: Diagnosis not present

## 2017-11-22 DIAGNOSIS — I10 Essential (primary) hypertension: Secondary | ICD-10-CM | POA: Diagnosis not present

## 2017-11-22 DIAGNOSIS — K219 Gastro-esophageal reflux disease without esophagitis: Secondary | ICD-10-CM | POA: Diagnosis not present

## 2017-11-22 DIAGNOSIS — Z0000 Encounter for general adult medical examination without abnormal findings: Secondary | ICD-10-CM | POA: Diagnosis not present

## 2017-11-22 DIAGNOSIS — K222 Esophageal obstruction: Secondary | ICD-10-CM | POA: Diagnosis not present

## 2017-11-22 DIAGNOSIS — Z950 Presence of cardiac pacemaker: Secondary | ICD-10-CM | POA: Diagnosis not present

## 2017-11-22 DIAGNOSIS — M1990 Unspecified osteoarthritis, unspecified site: Secondary | ICD-10-CM | POA: Diagnosis not present

## 2017-12-21 LAB — CUP PACEART INCLINIC DEVICE CHECK
Brady Statistic RV Percent Paced: 1 % — CL
Implantable Lead Implant Date: 20150818
Implantable Lead Location: 753859
Implantable Lead Location: 753860
Implantable Lead Model: 4135
Implantable Lead Model: 4136
Implantable Lead Serial Number: 29591923
Implantable Pulse Generator Implant Date: 20150818
Lead Channel Impedance Value: 490 Ohm
Lead Channel Impedance Value: 612 Ohm
Lead Channel Pacing Threshold Amplitude: 0.9 V
Lead Channel Pacing Threshold Pulse Width: 0.4 ms
Lead Channel Sensing Intrinsic Amplitude: 4.8 mV
Lead Channel Setting Pacing Amplitude: 2 V
Lead Channel Setting Pacing Pulse Width: 0.4 ms
Lead Channel Setting Sensing Sensitivity: 2.5 mV
MDC IDC LEAD IMPLANT DT: 20150818
MDC IDC LEAD SERIAL: 29563765
MDC IDC MSMT LEADCHNL RA PACING THRESHOLD AMPLITUDE: 0.6 V
MDC IDC MSMT LEADCHNL RA PACING THRESHOLD PULSEWIDTH: 0.4 ms
MDC IDC MSMT LEADCHNL RV SENSING INTR AMPL: 16.5 mV
MDC IDC PG SERIAL: 702583
MDC IDC SESS DTM: 20190227050000
MDC IDC SET LEADCHNL RV PACING AMPLITUDE: 1.6 V
MDC IDC STAT BRADY RA PERCENT PACED: 4 %

## 2017-12-29 ENCOUNTER — Ambulatory Visit
Admission: RE | Admit: 2017-12-29 | Discharge: 2017-12-29 | Disposition: A | Discharge status: Home / Self Care | Payer: Medicare Other | Source: Ambulatory Visit | Attending: Internal Medicine | Admitting: Internal Medicine

## 2017-12-29 DIAGNOSIS — Z1231 Encounter for screening mammogram for malignant neoplasm of breast: Secondary | ICD-10-CM | POA: Diagnosis not present

## 2017-12-29 DIAGNOSIS — Z139 Encounter for screening, unspecified: Secondary | ICD-10-CM

## 2018-01-15 ENCOUNTER — Ambulatory Visit (INDEPENDENT_AMBULATORY_CARE_PROVIDER_SITE_OTHER): Payer: Medicare Other | Admitting: *Deleted

## 2018-01-15 ENCOUNTER — Telehealth: Payer: Self-pay | Admitting: Cardiology

## 2018-01-15 DIAGNOSIS — R001 Bradycardia, unspecified: Secondary | ICD-10-CM

## 2018-01-15 NOTE — Progress Notes (Signed)
Remote pacemaker transmission.   

## 2018-01-15 NOTE — Telephone Encounter (Signed)
LMOVM reminding pt to send remote transmission.   

## 2018-01-16 LAB — CUP PACEART REMOTE DEVICE CHECK
Battery Remaining Longevity: 60 mo
Date Time Interrogation Session: 20190513094000
Implantable Lead Implant Date: 20150818
Implantable Lead Location: 753860
Implantable Lead Serial Number: 29563765
Implantable Lead Serial Number: 29591923
Implantable Pulse Generator Implant Date: 20150818
Lead Channel Pacing Threshold Amplitude: 0.4 V
Lead Channel Pacing Threshold Amplitude: 1 V
Lead Channel Setting Pacing Amplitude: 2 V
Lead Channel Setting Sensing Sensitivity: 2.5 mV
MDC IDC LEAD IMPLANT DT: 20150818
MDC IDC LEAD LOCATION: 753859
MDC IDC MSMT BATTERY REMAINING PERCENTAGE: 98 %
MDC IDC MSMT LEADCHNL RA IMPEDANCE VALUE: 471 Ohm
MDC IDC MSMT LEADCHNL RA PACING THRESHOLD PULSEWIDTH: 0.4 ms
MDC IDC MSMT LEADCHNL RV IMPEDANCE VALUE: 593 Ohm
MDC IDC MSMT LEADCHNL RV PACING THRESHOLD PULSEWIDTH: 0.4 ms
MDC IDC PG SERIAL: 702583
MDC IDC SET LEADCHNL RV PACING AMPLITUDE: 1.5 V
MDC IDC SET LEADCHNL RV PACING PULSEWIDTH: 0.4 ms
MDC IDC STAT BRADY RA PERCENT PACED: 5 %
MDC IDC STAT BRADY RV PERCENT PACED: 0 %

## 2018-01-17 ENCOUNTER — Encounter: Payer: Self-pay | Admitting: Cardiology

## 2018-03-13 DIAGNOSIS — R21 Rash and other nonspecific skin eruption: Secondary | ICD-10-CM | POA: Diagnosis not present

## 2018-03-21 DIAGNOSIS — D2271 Melanocytic nevi of right lower limb, including hip: Secondary | ICD-10-CM | POA: Diagnosis not present

## 2018-03-21 DIAGNOSIS — B0089 Other herpesviral infection: Secondary | ICD-10-CM | POA: Diagnosis not present

## 2018-04-16 ENCOUNTER — Ambulatory Visit (INDEPENDENT_AMBULATORY_CARE_PROVIDER_SITE_OTHER): Payer: Medicare Other | Admitting: *Deleted

## 2018-04-16 DIAGNOSIS — R001 Bradycardia, unspecified: Secondary | ICD-10-CM

## 2018-04-16 NOTE — Progress Notes (Signed)
Remote pacemaker transmission.   

## 2018-04-17 ENCOUNTER — Encounter: Payer: Self-pay | Admitting: Cardiology

## 2018-05-09 DIAGNOSIS — J309 Allergic rhinitis, unspecified: Secondary | ICD-10-CM | POA: Diagnosis not present

## 2018-05-09 DIAGNOSIS — E7800 Pure hypercholesterolemia, unspecified: Secondary | ICD-10-CM | POA: Diagnosis not present

## 2018-05-09 DIAGNOSIS — Z950 Presence of cardiac pacemaker: Secondary | ICD-10-CM | POA: Diagnosis not present

## 2018-05-09 DIAGNOSIS — I10 Essential (primary) hypertension: Secondary | ICD-10-CM | POA: Diagnosis not present

## 2018-05-09 DIAGNOSIS — R7303 Prediabetes: Secondary | ICD-10-CM | POA: Diagnosis not present

## 2018-05-09 DIAGNOSIS — K219 Gastro-esophageal reflux disease without esophagitis: Secondary | ICD-10-CM | POA: Diagnosis not present

## 2018-05-09 DIAGNOSIS — M1990 Unspecified osteoarthritis, unspecified site: Secondary | ICD-10-CM | POA: Diagnosis not present

## 2018-05-09 DIAGNOSIS — I4891 Unspecified atrial fibrillation: Secondary | ICD-10-CM | POA: Diagnosis not present

## 2018-05-12 LAB — CUP PACEART INCLINIC DEVICE CHECK
Implantable Lead Implant Date: 20150818
Implantable Lead Implant Date: 20150818
Implantable Lead Location: 753859
Implantable Lead Model: 4135
Implantable Lead Model: 4136
Implantable Lead Serial Number: 29591923
Implantable Pulse Generator Implant Date: 20150818
MDC IDC LEAD LOCATION: 753860
MDC IDC LEAD SERIAL: 29563765
MDC IDC PG SERIAL: 702583
MDC IDC SESS DTM: 20190907103911

## 2018-05-12 LAB — CUP PACEART REMOTE DEVICE CHECK
Date Time Interrogation Session: 20190907104309
Implantable Lead Implant Date: 20150818
Implantable Lead Location: 753860
Implantable Lead Model: 4136
Implantable Lead Serial Number: 29591923
Implantable Pulse Generator Implant Date: 20150818
MDC IDC LEAD IMPLANT DT: 20150818
MDC IDC LEAD LOCATION: 753859
MDC IDC LEAD SERIAL: 29563765
Pulse Gen Serial Number: 702583

## 2018-07-04 DIAGNOSIS — Z23 Encounter for immunization: Secondary | ICD-10-CM | POA: Diagnosis not present

## 2018-07-16 ENCOUNTER — Ambulatory Visit (INDEPENDENT_AMBULATORY_CARE_PROVIDER_SITE_OTHER): Payer: Medicare Other | Admitting: *Deleted

## 2018-07-16 DIAGNOSIS — R001 Bradycardia, unspecified: Secondary | ICD-10-CM | POA: Diagnosis not present

## 2018-07-16 NOTE — Progress Notes (Signed)
Remote pacemaker transmission.   

## 2018-09-15 LAB — CUP PACEART REMOTE DEVICE CHECK
Battery Remaining Percentage: 90 %
Date Time Interrogation Session: 20191111104100
Implantable Lead Implant Date: 20150818
Implantable Lead Location: 753859
Implantable Lead Model: 4135
Implantable Lead Serial Number: 29563765
Implantable Pulse Generator Implant Date: 20150818
Lead Channel Pacing Threshold Amplitude: 0.9 V
Lead Channel Pacing Threshold Pulse Width: 0.4 ms
Lead Channel Pacing Threshold Pulse Width: 0.4 ms
Lead Channel Setting Pacing Pulse Width: 0.4 ms
Lead Channel Setting Sensing Sensitivity: 2.5 mV
MDC IDC LEAD IMPLANT DT: 20150818
MDC IDC LEAD LOCATION: 753860
MDC IDC LEAD SERIAL: 29591923
MDC IDC MSMT BATTERY REMAINING LONGEVITY: 54 mo
MDC IDC MSMT LEADCHNL RA IMPEDANCE VALUE: 473 Ohm
MDC IDC MSMT LEADCHNL RA PACING THRESHOLD AMPLITUDE: 0.4 V
MDC IDC MSMT LEADCHNL RV IMPEDANCE VALUE: 539 Ohm
MDC IDC PG SERIAL: 702583
MDC IDC SET LEADCHNL RA PACING AMPLITUDE: 2 V
MDC IDC SET LEADCHNL RV PACING AMPLITUDE: 1.4 V
MDC IDC STAT BRADY RA PERCENT PACED: 5 %
MDC IDC STAT BRADY RV PERCENT PACED: 0 %

## 2018-10-03 DIAGNOSIS — R05 Cough: Secondary | ICD-10-CM | POA: Diagnosis not present

## 2018-10-15 ENCOUNTER — Ambulatory Visit (INDEPENDENT_AMBULATORY_CARE_PROVIDER_SITE_OTHER): Payer: Medicare Other

## 2018-10-15 DIAGNOSIS — R001 Bradycardia, unspecified: Secondary | ICD-10-CM

## 2018-10-15 LAB — CUP PACEART REMOTE DEVICE CHECK
Battery Remaining Longevity: 54 mo
Battery Remaining Percentage: 82 %
Brady Statistic RA Percent Paced: 5 %
Brady Statistic RV Percent Paced: 0 %
Implantable Lead Implant Date: 20150818
Implantable Lead Implant Date: 20150818
Implantable Lead Location: 753859
Implantable Lead Location: 753860
Implantable Lead Model: 4135
Implantable Lead Model: 4136
Implantable Lead Serial Number: 29563765
Implantable Lead Serial Number: 29591923
Implantable Pulse Generator Implant Date: 20150818
Lead Channel Impedance Value: 459 Ohm
Lead Channel Impedance Value: 507 Ohm
Lead Channel Pacing Threshold Amplitude: 0.5 V
Lead Channel Pacing Threshold Amplitude: 0.9 V
Lead Channel Pacing Threshold Pulse Width: 0.4 ms
Lead Channel Pacing Threshold Pulse Width: 0.4 ms
Lead Channel Setting Pacing Amplitude: 1.4 V
Lead Channel Setting Pacing Amplitude: 2 V
Lead Channel Setting Pacing Pulse Width: 0.4 ms
MDC IDC PG SERIAL: 702583
MDC IDC SESS DTM: 20200210121200
MDC IDC SET LEADCHNL RV SENSING SENSITIVITY: 2.5 mV

## 2018-10-25 NOTE — Progress Notes (Signed)
Remote pacemaker transmission.   

## 2018-12-21 ENCOUNTER — Telehealth: Payer: Self-pay

## 2018-12-21 ENCOUNTER — Ambulatory Visit (INDEPENDENT_AMBULATORY_CARE_PROVIDER_SITE_OTHER): Payer: Medicare Other | Admitting: *Deleted

## 2018-12-21 ENCOUNTER — Other Ambulatory Visit: Payer: Self-pay

## 2018-12-21 DIAGNOSIS — R001 Bradycardia, unspecified: Secondary | ICD-10-CM

## 2018-12-21 LAB — CUP PACEART REMOTE DEVICE CHECK
Battery Remaining Longevity: 48 mo
Battery Remaining Percentage: 82 %
Brady Statistic RA Percent Paced: 5 %
Brady Statistic RV Percent Paced: 0 %
Date Time Interrogation Session: 20200417170328
Implantable Lead Implant Date: 20150818
Implantable Lead Implant Date: 20150818
Implantable Lead Location: 753859
Implantable Lead Location: 753860
Implantable Lead Model: 4135
Implantable Lead Model: 4136
Implantable Lead Serial Number: 29563765
Implantable Lead Serial Number: 29591923
Implantable Pulse Generator Implant Date: 20150818
Lead Channel Impedance Value: 447 Ohm
Lead Channel Impedance Value: 507 Ohm
Lead Channel Pacing Threshold Amplitude: 0.5 V
Lead Channel Pacing Threshold Amplitude: 0.9 V
Lead Channel Pacing Threshold Pulse Width: 0.4 ms
Lead Channel Pacing Threshold Pulse Width: 0.4 ms
Lead Channel Setting Pacing Amplitude: 1.4 V
Lead Channel Setting Pacing Amplitude: 2 V
Lead Channel Setting Pacing Pulse Width: 0.4 ms
Lead Channel Setting Sensing Sensitivity: 2.5 mV
Pulse Gen Serial Number: 702583

## 2018-12-21 NOTE — Telephone Encounter (Signed)
Spoke with patient to remind of missed remote transmission 

## 2018-12-21 NOTE — Telephone Encounter (Signed)
Virtual Visit Pre-Appointment Phone Call  Steps For Call:  1. Confirm consent - "In the setting of the current Covid19 crisis, you are scheduled for a (phone or video) visit with your provider on (date) at (time).  Just as we do with many in-office visits, in order for you to participate in this visit, we must obtain consent.  If you'd like, I can send this to your mychart (if signed up) or email for you to review.  Otherwise, I can obtain your verbal consent now.  All virtual visits are billed to your insurance company just like a normal visit would be.  By agreeing to a virtual visit, we'd like you to understand that the technology does not allow for your provider to perform an examination, and thus may limit your provider's ability to fully assess your condition. If your provider identifies any concerns that need to be evaluated in person, we will make arrangements to do so.  Finally, though the technology is pretty good, we cannot assure that it will always work on either your or our end, and in the setting of a video visit, we may have to convert it to a phone-only visit.  In either situation, we cannot ensure that we have a secure connection.  Are you willing to proceed?" STAFF: Did the patient verbally acknowledge consent to telehealth visit? Document YES/NO here: YES  2. Confirm the BEST phone number to call the day of the visit by including in appointment notes  3. Give patient instructions for WebEx/MyChart download to smartphone as below or Doximity/Doxy.me if video visit (depending on what platform provider is using)  4. Advise patient to be prepared with their blood pressure, heart rate, weight, any heart rhythm information, their current medicines, and a piece of paper and pen handy for any instructions they may receive the day of their visit  5. Inform patient they will receive a phone call 15 minutes prior to their appointment time (may be from unknown caller ID) so they should be  prepared to answer  6. Confirm that appointment type is correct in Epic appointment notes (VIDEO vs PHONE)     TELEPHONE CALL NOTE  DESTENEE GUERRY has been deemed a candidate for a follow-up tele-health visit to limit community exposure during the Covid-19 pandemic. I spoke with the patient via phone to ensure availability of phone/video source, confirm preferred email & phone number, and discuss instructions and expectations.  I reminded CARLETHIA MESQUITA to be prepared with any vital sign and/or heart rhythm information that could potentially be obtained via home monitoring, at the time of her visit. I reminded WILLIS KUIPERS to expect a phone call at the time of her visit if her visit.  Mady Haagensen, [place] 12/21/2018 12:16 PM   INSTRUCTIONS FOR DOWNLOADING THE [place] APP TO SMARTPHONE  - If Apple, ask patient to go to [employer] and type in WebEx in the search bar. [place] [employer], the blue/green circle. If Android, go to [employer] and type in [employer] in the search bar. The app is free but as with any other app downloads, their phone may require them to verify saved payment information or Apple/Android password.  - The patient does NOT have to create an account. - On the day of the visit, the assist will walk the patient through joining the meeting with the meeting number/password.  INSTRUCTIONS FOR DOWNLOADING THE MYCHART APP TO SMARTPHONE  - The patient must first  make sure to have activated MyChart and know their login information - If Apple, go to [employer] and type in MyChart in the search bar and download the app. If Android, ask patient to go to [employer] and type in [place] in the search bar and download the app. The app is free but as with any other app downloads, their phone may require them to verify saved payment information or Apple/Android password.  - The patient will need to then log into the app with their MyChart username and password, and select   as their healthcare provider to link the account. When it is time for your visit, go to the MyChart app, find appointments, and click Begin Video Visit. Be sure to Select Allow for your device to access the Microphone and Camera for your visit. You will then be connected, and your provider will be with you shortly.  **If they have any issues connecting, or need assistance please contact MyChart service desk [phone])**  **If using a computer, in order to ensure the best quality for their visit they will need to use either of the following Internet Browsers: [employer], or Google Chrome**  IF USING DOXIMITY or DOXY.ME - The patient will receive a link just prior to their visit, either by text or email (to be determined day of appointment depending on if it's doxy.me or Doximity).     FULL LENGTH CONSENT FOR TELE-HEALTH VISIT   I hereby voluntarily request, consent and authorize [facility] and its employed or contracted physicians, physician assistants, nurse practitioners or other licensed health care professionals (the Practitioner), to provide me with telemedicine health care services (the "Services") as deemed necessary by the treating Practitioner. I acknowledge and consent to receive the Services by the Practitioner via telemedicine. I understand that the telemedicine visit will involve communicating with the Practitioner through live audiovisual communication technology and the disclosure of certain medical information by electronic transmission. I acknowledge that I have been given the opportunity to request an in-person assessment or other available alternative prior to the telemedicine visit and am voluntarily participating in the telemedicine visit.  I understand that I have the right to withhold or withdraw my consent to the use of telemedicine in the course of my care at any time, without affecting my right to future care or treatment, and that the  Practitioner or I may terminate the telemedicine visit at any time. I understand that I have the right to inspect all information obtained and/or recorded in the course of the telemedicine visit and may receive copies of available information for a reasonable fee.  I understand that some of the potential risks of receiving the Services via telemedicine include:  Marland Kitchen Delay or interruption in medical evaluation due to technological equipment failure or disruption; . Information transmitted may not be sufficient (e.g. poor resolution of images) to allow for appropriate medical decision making by the Practitioner; and/or  . In rare instances, security protocols could fail, causing a breach of personal health information.  Furthermore, I acknowledge that it is my responsibility to provide information about my medical history, conditions and care that is complete and accurate to the best of my ability. I acknowledge that Practitioner's advice, recommendations, and/or decision may be based on factors not within their control, such as incomplete or inaccurate data provided by me or distortions of diagnostic images or specimens that may result from electronic transmissions. I understand that the practice of medicine is not an  exact science and that Practitioner makes no warranties or guarantees regarding treatment outcomes. I acknowledge that I will receive a copy of this consent concurrently upon execution via email to the email address I last provided but may also request a printed copy by calling the office of [facility].    I understand that my insurance will be billed for this visit.   I have read or had this consent read to me. . I understand the contents of this consent, which adequately explains the benefits and risks of the Services being provided via telemedicine.  . I have been provided ample opportunity to ask questions regarding this consent and the Services and have had my questions answered to my  satisfaction. . I give my informed consent for the services to be provided through the use of telemedicine in my medical care  By participating in this telemedicine visit I agree to the above.

## 2018-12-24 ENCOUNTER — Telehealth: Payer: Self-pay | Admitting: Internal Medicine

## 2018-12-24 ENCOUNTER — Other Ambulatory Visit: Payer: Self-pay

## 2018-12-24 ENCOUNTER — Telehealth (INDEPENDENT_AMBULATORY_CARE_PROVIDER_SITE_OTHER): Payer: Medicare Other | Admitting: Internal Medicine

## 2018-12-24 VITALS — BP 168/83 | HR 61 | Ht 68.0 in | Wt 154.3 lb

## 2018-12-24 DIAGNOSIS — R001 Bradycardia, unspecified: Secondary | ICD-10-CM

## 2018-12-24 DIAGNOSIS — Z950 Presence of cardiac pacemaker: Secondary | ICD-10-CM

## 2018-12-24 NOTE — Telephone Encounter (Signed)
Pt was able to receive a call back from Dr Caryl Comes. We reviewed recommendations of BP monitoring x 2 weeks, then contacting us back with readings. Pt states she may run out of Eliquis. She ordered a refill, but her mail order delivery is running behind. If this becomes troublesome, we may be able to supply her with a week sample of 5mg  Eliquis until her refill arrives. Pt will call the office next Monday if she needs the refill.   She has verbalized understanding of the above and had no additional questions.

## 2018-12-24 NOTE — Progress Notes (Signed)
Electrophysiology TeleHealth Note   Due to national recommendations of social distancing due to COVID 19, an audio/video telehealth visit is felt to be most appropriate for this patient at this time.  See MyChart message from today for the patient's consent to telehealth for [facility].   Date:  12/24/2018   ID:  Bianca Contreras, DOB [birth year], MRN [id number]  Location: patient's home  Provider location: [address], [place]  Evaluation Performed: Follow-up visit  PCP:  Wenda Low, MD  Cardiologist:   Electrophysiologist:  SK   Chief Complaint:  syncope  History of Present Illness:    Bianca Contreras is a 78 y.o. female who presents via audio/video conferencing for a telehealth visit today. The patient did not have access to video technology/had technical difficulties with video requiring transitioning to audio format only (telephone).  All issues noted in this document were discussed and addressed.  No physical exam could be performed with this format.     Since last being seen in our clinic, the patient reports doing pretty well;  Now taking multiple supplements  Husband died Nov 19, 2022  Had been suffering from dementia; had had a fall; died at [facility]   Had been married 81 yrs;     Energy pretty good,  The patient denies chest pain, shortness of breath, nocturnal dyspnea, orthopnea or peripheral edema.  There have been no palpitations, lightheadedness or syncope.    Date Cr K Hgb  2/19 0.98 3.8 12.4         No bleeding    The patient denies symptoms of fevers, chills, cough, or new SOB worrisome for COVID 19.    No past medical history on file.  Past Surgical History:  Procedure Laterality Date  . BREAST BIOPSY     25 years ago cyst biopsy  . INSERT / REPLACE / REMOVE PACEMAKER      Current Outpatient Medications  Medication Sig Dispense Refill  . amLODipine (NORVASC) 2.5 MG tablet Take 1 tablet by mouth daily.    . Ascorbic Acid (VITAMIN  C) 1000 MG tablet Take 2,000 mg by mouth daily.     . Calcium Carb-Cholecalciferol (CALCIUM-VITAMIN D) 500-200 MG-UNIT tablet Take 1 tablet by mouth daily.    Marland Kitchen ELIQUIS 5 MG TABS tablet Take 1 tablet by mouth 2 (two) times daily.    . fluticasone (FLONASE) 50 MCG/ACT nasal spray Place 2 sprays into both nostrils daily as needed for allergies or rhinitis.    . Loratadine 10 MG CAPS Take 10 mg by mouth daily.    . metoprolol tartrate (LOPRESSOR) 50 MG tablet Take 1 tablet by mouth 2 (two) times daily.    . mometasone (ELOCON) 0.1 % cream Apply 1 application topically daily as needed (for dry skin).    Marland Kitchen omeprazole (PRILOSEC) 20 MG capsule Take 1 capsule by mouth 2 (two) times daily.     . rosuvastatin (CRESTOR) 5 MG tablet Take 1 tablet by mouth daily.    . vitamin B-12 (CYANOCOBALAMIN) 1000 MCG tablet Take 6,000 mcg by mouth daily.      No current facility-administered medications for this visit.     Allergies:   Patient has no known allergies.   Social History:  The patient  reports that she has quit smoking. She has never used smokeless tobacco. She reports current alcohol use. She reports that she does not use drugs.   Family History:  The patient's   family history includes  Heart attack in her mother.   ROS:  Please see the history of present illness.   All other systems are personally reviewed and negative.    Exam:    Vital Signs:  BP (!) 168/83   Pulse 61   Ht 5\' 8"  (1.727 m)   Wt 154 lb 4.8 oz (70 kg)   BMI 23.46 kg/m     Well appearing, alert and conversant, regular work of breathing,  good skin color Eyes- anicteric, neuro- grossly intact, skin- no apparent rash or lesions or cyanosis, mouth- oral mucosa is pink   Labs/Other Tests and Data Reviewed:    Recent Labs: No results found for requested labs within last 8760 hours.   Wt Readings from Last 3 Encounters:  12/24/18 154 lb 4.8 oz (70 kg)  11/01/17 165 lb 12.8 oz (75.2 kg)  10/14/16 162 lb (73.5 kg)      Other studies personally reviewed:      Last device remote is reviewed from [facility] PDF dated 4/20 which reveals normal device function,   arrhythmias - none     ASSESSMENT & PLAN:   Syncope-probably neurally mediated  Pacemaker-St. Jude  Doing well   Continue current    COVID 19 screen The patient denies symptoms of COVID 19 at this time.  The importance of social distancing was discussed today.  Follow-up:  32 m Next remote: 31m Will reach out in about 2 weeks to get a record  Current medicines are reviewed at length with the patient today.   The patient has concerns regarding her medicines.  The following changes were made today:  Increase amlodipine   Labs/ tests ordered today include BMET CBC @ 6weeks  No orders of the defined types were placed in this encounter.  What is it I am sorry Barnabas Lister ligation area none throat right Those people running around    Patient Risk:  after full review of this patients clinical status, I feel that they are at moderate risk at this time.  Today, I have spent  16 minutes with the patient with telehealth technology discussing the above.  Signed, Virl Axe, MD  12/24/2018 1:50 PM     [place] [address] [phone] (office) [phone] (fax)

## 2018-12-24 NOTE — Telephone Encounter (Signed)
New Message   Pt is returning call for her appt, she said it got disconnected   Please call back

## 2018-12-26 NOTE — Progress Notes (Signed)
Remote pacemaker transmission.   

## 2018-12-27 ENCOUNTER — Telehealth: Payer: Self-pay | Admitting: Internal Medicine

## 2018-12-27 NOTE — Telephone Encounter (Signed)
  Patient wanted to let the nurse know that she received her Eliquis

## 2019-01-14 ENCOUNTER — Other Ambulatory Visit: Payer: Self-pay

## 2019-01-14 ENCOUNTER — Ambulatory Visit (INDEPENDENT_AMBULATORY_CARE_PROVIDER_SITE_OTHER): Payer: Medicare Other | Admitting: *Deleted

## 2019-01-14 DIAGNOSIS — R001 Bradycardia, unspecified: Secondary | ICD-10-CM

## 2019-01-15 LAB — CUP PACEART REMOTE DEVICE CHECK
Battery Remaining Longevity: 48 mo
Battery Remaining Percentage: 80 %
Brady Statistic RA Percent Paced: 5 %
Brady Statistic RV Percent Paced: 0 %
Date Time Interrogation Session: 20200511094100
Implantable Lead Implant Date: 20150818
Implantable Lead Implant Date: 20150818
Implantable Lead Location: 753859
Implantable Lead Location: 753860
Implantable Lead Model: 4135
Implantable Lead Model: 4136
Implantable Lead Serial Number: 29563765
Implantable Lead Serial Number: 29591923
Implantable Pulse Generator Implant Date: 20150818
Lead Channel Impedance Value: 472 Ohm
Lead Channel Impedance Value: 538 Ohm
Lead Channel Pacing Threshold Amplitude: 0.5 V
Lead Channel Pacing Threshold Amplitude: 0.8 V
Lead Channel Pacing Threshold Pulse Width: 0.4 ms
Lead Channel Pacing Threshold Pulse Width: 0.4 ms
Lead Channel Setting Pacing Amplitude: 1.4 V
Lead Channel Setting Pacing Amplitude: 2 V
Lead Channel Setting Pacing Pulse Width: 0.4 ms
Lead Channel Setting Sensing Sensitivity: 2.5 mV
Pulse Gen Serial Number: 702583

## 2019-01-22 ENCOUNTER — Telehealth: Payer: Self-pay

## 2019-01-22 DIAGNOSIS — R001 Bradycardia, unspecified: Secondary | ICD-10-CM

## 2019-01-22 NOTE — Progress Notes (Signed)
Remote pacemaker transmission.   

## 2019-01-22 NOTE — Telephone Encounter (Signed)
Look great   Lorren Thx SK

## 2019-01-22 NOTE — Telephone Encounter (Signed)
Spoke with pt today to review her BP readings from the last two weeks. She states her SBP's have ranged between 135 (highest) and 125 (lowest); DBP 82 (highest) and 60 (lowest). She states she feels well at this time. I advised her if Dr Caryl Comes wanted to make a medication change, I would contact her back.  Pt also set up a date and time for her annual BMP and CBC.   She has no additional needs or concerns at this time.

## 2019-01-22 NOTE — Telephone Encounter (Signed)
-----   Message from Dollene Primrose, RN sent at 12/24/2018  2:26 PM EDT ----- Regarding: BP monitoring Call pt to review BP monitoring x 2 weeks. Dr Raliegh Ip may want to increase her Amlodipine.

## 2019-02-01 ENCOUNTER — Telehealth: Payer: Self-pay | Admitting: Internal Medicine

## 2019-02-01 ENCOUNTER — Other Ambulatory Visit: Payer: Self-pay | Admitting: Internal Medicine

## 2019-02-01 DIAGNOSIS — Z1231 Encounter for screening mammogram for malignant neoplasm of breast: Secondary | ICD-10-CM

## 2019-02-01 NOTE — Telephone Encounter (Signed)
Pt calling to cancel her lab appointment. She states her PCP will be drawing annual labs next week and will have a copy sent to Dr Caryl Comes for review.

## 2019-02-01 NOTE — Telephone Encounter (Signed)
New message:   Patient calling concering her labs. Patient would like to speak with some one regarding her labs.

## 2019-02-04 ENCOUNTER — Other Ambulatory Visit: Payer: Medicare Other

## 2019-02-04 DIAGNOSIS — Z1389 Encounter for screening for other disorder: Secondary | ICD-10-CM | POA: Diagnosis not present

## 2019-02-04 DIAGNOSIS — I10 Essential (primary) hypertension: Secondary | ICD-10-CM | POA: Diagnosis not present

## 2019-02-04 DIAGNOSIS — K222 Esophageal obstruction: Secondary | ICD-10-CM | POA: Diagnosis not present

## 2019-02-04 DIAGNOSIS — M1990 Unspecified osteoarthritis, unspecified site: Secondary | ICD-10-CM | POA: Diagnosis not present

## 2019-02-04 DIAGNOSIS — I4891 Unspecified atrial fibrillation: Secondary | ICD-10-CM | POA: Diagnosis not present

## 2019-02-04 DIAGNOSIS — E7800 Pure hypercholesterolemia, unspecified: Secondary | ICD-10-CM | POA: Diagnosis not present

## 2019-02-04 DIAGNOSIS — R7303 Prediabetes: Secondary | ICD-10-CM | POA: Diagnosis not present

## 2019-02-04 DIAGNOSIS — J309 Allergic rhinitis, unspecified: Secondary | ICD-10-CM | POA: Diagnosis not present

## 2019-02-04 DIAGNOSIS — Z950 Presence of cardiac pacemaker: Secondary | ICD-10-CM | POA: Diagnosis not present

## 2019-02-12 DIAGNOSIS — R7303 Prediabetes: Secondary | ICD-10-CM | POA: Diagnosis not present

## 2019-02-12 DIAGNOSIS — I10 Essential (primary) hypertension: Secondary | ICD-10-CM | POA: Diagnosis not present

## 2019-02-12 DIAGNOSIS — E7800 Pure hypercholesterolemia, unspecified: Secondary | ICD-10-CM | POA: Diagnosis not present

## 2019-04-01 ENCOUNTER — Other Ambulatory Visit: Payer: Self-pay

## 2019-04-01 ENCOUNTER — Ambulatory Visit
Admission: RE | Admit: 2019-04-01 | Discharge: 2019-04-01 | Disposition: A | Discharge status: Home / Self Care | Payer: Medicare Other | Source: Ambulatory Visit | Attending: Internal Medicine | Admitting: Internal Medicine

## 2019-04-01 DIAGNOSIS — Z1231 Encounter for screening mammogram for malignant neoplasm of breast: Secondary | ICD-10-CM

## 2019-04-15 ENCOUNTER — Ambulatory Visit (INDEPENDENT_AMBULATORY_CARE_PROVIDER_SITE_OTHER): Payer: Medicare Other | Admitting: *Deleted

## 2019-04-15 DIAGNOSIS — R001 Bradycardia, unspecified: Secondary | ICD-10-CM

## 2019-04-15 LAB — CUP PACEART REMOTE DEVICE CHECK
Battery Remaining Longevity: 48 mo
Battery Remaining Percentage: 75 %
Brady Statistic RA Percent Paced: 5 %
Brady Statistic RV Percent Paced: 0 %
Date Time Interrogation Session: 20200810095400
Implantable Lead Implant Date: 20150818
Implantable Lead Implant Date: 20150818
Implantable Lead Location: 753859
Implantable Lead Location: 753860
Implantable Lead Model: 4135
Implantable Lead Model: 4136
Implantable Lead Serial Number: 29563765
Implantable Lead Serial Number: 29591923
Implantable Pulse Generator Implant Date: 20150818
Lead Channel Impedance Value: 471 Ohm
Lead Channel Impedance Value: 544 Ohm
Lead Channel Pacing Threshold Amplitude: 0.4 V
Lead Channel Pacing Threshold Amplitude: 0.8 V
Lead Channel Pacing Threshold Pulse Width: 0.4 ms
Lead Channel Pacing Threshold Pulse Width: 0.4 ms
Lead Channel Setting Pacing Amplitude: 1.4 V
Lead Channel Setting Pacing Amplitude: 2 V
Lead Channel Setting Pacing Pulse Width: 0.4 ms
Lead Channel Setting Sensing Sensitivity: 2.5 mV
Pulse Gen Serial Number: 702583

## 2019-04-23 NOTE — Progress Notes (Signed)
Remote pacemaker transmission.   

## 2019-05-07 DIAGNOSIS — Z23 Encounter for immunization: Secondary | ICD-10-CM | POA: Diagnosis not present

## 2019-07-15 ENCOUNTER — Ambulatory Visit (INDEPENDENT_AMBULATORY_CARE_PROVIDER_SITE_OTHER): Payer: Medicare Other | Admitting: *Deleted

## 2019-07-15 DIAGNOSIS — R001 Bradycardia, unspecified: Secondary | ICD-10-CM | POA: Diagnosis not present

## 2019-07-15 DIAGNOSIS — Z950 Presence of cardiac pacemaker: Secondary | ICD-10-CM

## 2019-07-16 LAB — CUP PACEART REMOTE DEVICE CHECK
Battery Remaining Longevity: 42 mo
Battery Remaining Percentage: 72 %
Brady Statistic RA Percent Paced: 5 %
Brady Statistic RV Percent Paced: 0 %
Date Time Interrogation Session: 20201109104100
Implantable Lead Implant Date: 20150818
Implantable Lead Implant Date: 20150818
Implantable Lead Location: 753859
Implantable Lead Location: 753860
Implantable Lead Model: 4135
Implantable Lead Model: 4136
Implantable Lead Serial Number: 29563765
Implantable Lead Serial Number: 29591923
Implantable Pulse Generator Implant Date: 20150818
Lead Channel Impedance Value: 469 Ohm
Lead Channel Impedance Value: 555 Ohm
Lead Channel Pacing Threshold Amplitude: 0.4 V
Lead Channel Pacing Threshold Amplitude: 0.8 V
Lead Channel Pacing Threshold Pulse Width: 0.4 ms
Lead Channel Pacing Threshold Pulse Width: 0.4 ms
Lead Channel Setting Pacing Amplitude: 1.3 V
Lead Channel Setting Pacing Amplitude: 2 V
Lead Channel Setting Pacing Pulse Width: 0.4 ms
Lead Channel Setting Sensing Sensitivity: 2.5 mV
Pulse Gen Serial Number: 702583

## 2019-08-07 DIAGNOSIS — K219 Gastro-esophageal reflux disease without esophagitis: Secondary | ICD-10-CM | POA: Diagnosis not present

## 2019-08-07 DIAGNOSIS — I10 Essential (primary) hypertension: Secondary | ICD-10-CM | POA: Diagnosis not present

## 2019-08-07 DIAGNOSIS — Z950 Presence of cardiac pacemaker: Secondary | ICD-10-CM | POA: Diagnosis not present

## 2019-08-07 DIAGNOSIS — E7800 Pure hypercholesterolemia, unspecified: Secondary | ICD-10-CM | POA: Diagnosis not present

## 2019-08-07 DIAGNOSIS — D6869 Other thrombophilia: Secondary | ICD-10-CM | POA: Diagnosis not present

## 2019-08-07 DIAGNOSIS — I4891 Unspecified atrial fibrillation: Secondary | ICD-10-CM | POA: Diagnosis not present

## 2019-08-07 DIAGNOSIS — R7303 Prediabetes: Secondary | ICD-10-CM | POA: Diagnosis not present

## 2019-08-10 NOTE — Progress Notes (Signed)
Remote pacemaker transmission.   

## 2019-10-13 ENCOUNTER — Ambulatory Visit: Payer: Medicare Other | Attending: Internal Medicine

## 2019-10-13 DIAGNOSIS — Z23 Encounter for immunization: Secondary | ICD-10-CM

## 2019-10-13 NOTE — Progress Notes (Signed)
   Covid-19 Vaccination Clinic  Name:  Bianca Contreras    MRN: [id number] DOB: May 17, 1941  10/13/2019  [name] was observed post Covid-19 immunization for 15 minutes without incidence. She was provided with Vaccine Information Sheet and instruction to access the V-Safe system.   [name] was instructed to call 911 with any severe reactions post vaccine: Marland Kitchen Difficulty breathing  . Swelling of your face and throat  . A fast heartbeat  . A bad rash all over your body  . Dizziness and weakness    Immunizations Administered    Name Date Dose VIS Date Route   Pfizer COVID-19 Vaccine 10/13/2019  4:02 PM 0.3 mL 08/16/2019 Intramuscular   Manufacturer: [place]   Lot: [id number]   [place]: [id number]

## 2019-10-14 ENCOUNTER — Ambulatory Visit (INDEPENDENT_AMBULATORY_CARE_PROVIDER_SITE_OTHER): Payer: Medicare Other | Admitting: *Deleted

## 2019-10-14 DIAGNOSIS — Z950 Presence of cardiac pacemaker: Secondary | ICD-10-CM

## 2019-10-14 LAB — CUP PACEART REMOTE DEVICE CHECK
Battery Remaining Longevity: 42 mo
Battery Remaining Percentage: 67 %
Brady Statistic RA Percent Paced: 5 %
Brady Statistic RV Percent Paced: 0 %
Date Time Interrogation Session: 20210208061900
Implantable Lead Implant Date: 20150818
Implantable Lead Implant Date: 20150818
Implantable Lead Location: 753859
Implantable Lead Location: 753860
Implantable Lead Model: 4135
Implantable Lead Model: 4136
Implantable Lead Serial Number: 29563765
Implantable Lead Serial Number: 29591923
Implantable Pulse Generator Implant Date: 20150818
Lead Channel Impedance Value: 464 Ohm
Lead Channel Impedance Value: 543 Ohm
Lead Channel Pacing Threshold Amplitude: 0.4 V
Lead Channel Pacing Threshold Amplitude: 0.8 V
Lead Channel Pacing Threshold Pulse Width: 0.4 ms
Lead Channel Pacing Threshold Pulse Width: 0.4 ms
Lead Channel Setting Pacing Amplitude: 1.3 V
Lead Channel Setting Pacing Amplitude: 2 V
Lead Channel Setting Pacing Pulse Width: 0.4 ms
Lead Channel Setting Sensing Sensitivity: 2.5 mV
Pulse Gen Serial Number: 702583

## 2019-10-15 NOTE — Progress Notes (Signed)
PPM Remote  

## 2019-10-30 ENCOUNTER — Ambulatory Visit: Payer: Medicare Other

## 2019-11-07 ENCOUNTER — Ambulatory Visit: Payer: Medicare Other | Attending: Internal Medicine

## 2019-11-07 DIAGNOSIS — Z23 Encounter for immunization: Secondary | ICD-10-CM | POA: Insufficient documentation

## 2019-11-07 NOTE — Progress Notes (Signed)
   Covid-19 Vaccination Clinic  Name:  Bianca Contreras    MRN: [id number] DOB: [birth year]  11/07/2019  [name] was observed post Covid-19 immunization for 15 minutes without incident. She was provided with Vaccine Information Sheet and instruction to access the V-Safe system.   [name] was instructed to call 911 with any severe reactions post vaccine: Marland Kitchen Difficulty breathing  . Swelling of face and throat  . A fast heartbeat  . A bad rash all over body  . Dizziness and weakness   Immunizations Administered    Name Date Dose VIS Date Route   Pfizer COVID-19 Vaccine 11/07/2019 10:37 AM 0.3 mL 08/16/2019 Intramuscular   Manufacturer: [place]   Lot: [id number]   [place]: [id number]

## 2019-11-20 DIAGNOSIS — Z0000 Encounter for general adult medical examination without abnormal findings: Secondary | ICD-10-CM | POA: Diagnosis not present

## 2019-11-20 DIAGNOSIS — J309 Allergic rhinitis, unspecified: Secondary | ICD-10-CM | POA: Diagnosis not present

## 2019-11-20 DIAGNOSIS — R7309 Other abnormal glucose: Secondary | ICD-10-CM | POA: Diagnosis not present

## 2019-11-20 DIAGNOSIS — I10 Essential (primary) hypertension: Secondary | ICD-10-CM | POA: Diagnosis not present

## 2019-11-20 DIAGNOSIS — K219 Gastro-esophageal reflux disease without esophagitis: Secondary | ICD-10-CM | POA: Diagnosis not present

## 2019-11-20 DIAGNOSIS — Z1389 Encounter for screening for other disorder: Secondary | ICD-10-CM | POA: Diagnosis not present

## 2019-11-20 DIAGNOSIS — M1990 Unspecified osteoarthritis, unspecified site: Secondary | ICD-10-CM | POA: Diagnosis not present

## 2019-11-20 DIAGNOSIS — I4891 Unspecified atrial fibrillation: Secondary | ICD-10-CM | POA: Diagnosis not present

## 2019-11-20 DIAGNOSIS — Z950 Presence of cardiac pacemaker: Secondary | ICD-10-CM | POA: Diagnosis not present

## 2019-11-20 DIAGNOSIS — D6869 Other thrombophilia: Secondary | ICD-10-CM | POA: Diagnosis not present

## 2019-11-20 DIAGNOSIS — E7800 Pure hypercholesterolemia, unspecified: Secondary | ICD-10-CM | POA: Diagnosis not present

## 2019-11-20 DIAGNOSIS — K222 Esophageal obstruction: Secondary | ICD-10-CM | POA: Diagnosis not present

## 2019-12-04 DIAGNOSIS — H6123 Impacted cerumen, bilateral: Secondary | ICD-10-CM | POA: Diagnosis not present

## 2019-12-18 DIAGNOSIS — H6120 Impacted cerumen, unspecified ear: Secondary | ICD-10-CM | POA: Diagnosis not present

## 2019-12-18 DIAGNOSIS — H6123 Impacted cerumen, bilateral: Secondary | ICD-10-CM | POA: Diagnosis not present

## 2020-01-13 ENCOUNTER — Ambulatory Visit (INDEPENDENT_AMBULATORY_CARE_PROVIDER_SITE_OTHER): Payer: Medicare Other | Admitting: *Deleted

## 2020-01-13 DIAGNOSIS — R001 Bradycardia, unspecified: Secondary | ICD-10-CM

## 2020-01-13 LAB — CUP PACEART REMOTE DEVICE CHECK
Battery Remaining Longevity: 36 mo
Battery Remaining Percentage: 60 %
Brady Statistic RA Percent Paced: 5 %
Brady Statistic RV Percent Paced: 0 %
Date Time Interrogation Session: 20210510054100
Implantable Lead Implant Date: 20150818
Implantable Lead Implant Date: 20150818
Implantable Lead Location: 753859
Implantable Lead Location: 753860
Implantable Lead Model: 4135
Implantable Lead Model: 4136
Implantable Lead Serial Number: 29563765
Implantable Lead Serial Number: 29591923
Implantable Pulse Generator Implant Date: 20150818
Lead Channel Impedance Value: 464 Ohm
Lead Channel Impedance Value: 552 Ohm
Lead Channel Pacing Threshold Amplitude: 0.4 V
Lead Channel Pacing Threshold Amplitude: 0.8 V
Lead Channel Pacing Threshold Pulse Width: 0.4 ms
Lead Channel Pacing Threshold Pulse Width: 0.4 ms
Lead Channel Setting Pacing Amplitude: 1.3 V
Lead Channel Setting Pacing Amplitude: 2 V
Lead Channel Setting Pacing Pulse Width: 0.4 ms
Lead Channel Setting Sensing Sensitivity: 2.5 mV
Pulse Gen Serial Number: 702583

## 2020-01-13 NOTE — Progress Notes (Signed)
Remote pacemaker transmission.   

## 2020-02-26 ENCOUNTER — Other Ambulatory Visit: Payer: Self-pay | Admitting: Internal Medicine

## 2020-02-26 DIAGNOSIS — Z1231 Encounter for screening mammogram for malignant neoplasm of breast: Secondary | ICD-10-CM

## 2020-04-03 ENCOUNTER — Other Ambulatory Visit: Payer: Self-pay

## 2020-04-03 ENCOUNTER — Ambulatory Visit
Admission: RE | Admit: 2020-04-03 | Discharge: 2020-04-03 | Disposition: A | Discharge status: Home / Self Care | Payer: Medicare Other | Source: Ambulatory Visit | Attending: Internal Medicine | Admitting: Internal Medicine

## 2020-04-03 DIAGNOSIS — Z1231 Encounter for screening mammogram for malignant neoplasm of breast: Secondary | ICD-10-CM

## 2020-04-13 ENCOUNTER — Ambulatory Visit (INDEPENDENT_AMBULATORY_CARE_PROVIDER_SITE_OTHER): Payer: Medicare Other | Admitting: *Deleted

## 2020-04-13 DIAGNOSIS — Z950 Presence of cardiac pacemaker: Secondary | ICD-10-CM

## 2020-04-13 LAB — CUP PACEART REMOTE DEVICE CHECK
Battery Remaining Longevity: 36 mo
Battery Remaining Percentage: 55 %
Brady Statistic RA Percent Paced: 5 %
Brady Statistic RV Percent Paced: 0 %
Date Time Interrogation Session: 20210809055400
Implantable Lead Implant Date: 20150818
Implantable Lead Implant Date: 20150818
Implantable Lead Location: 753859
Implantable Lead Location: 753860
Implantable Lead Model: 4135
Implantable Lead Model: 4136
Implantable Lead Serial Number: 29563765
Implantable Lead Serial Number: 29591923
Implantable Pulse Generator Implant Date: 20150818
Lead Channel Impedance Value: 457 Ohm
Lead Channel Impedance Value: 529 Ohm
Lead Channel Pacing Threshold Amplitude: 0.4 V
Lead Channel Pacing Threshold Amplitude: 0.8 V
Lead Channel Pacing Threshold Pulse Width: 0.4 ms
Lead Channel Pacing Threshold Pulse Width: 0.4 ms
Lead Channel Setting Pacing Amplitude: 1.4 V
Lead Channel Setting Pacing Amplitude: 2 V
Lead Channel Setting Pacing Pulse Width: 0.4 ms
Lead Channel Setting Sensing Sensitivity: 2.5 mV
Pulse Gen Serial Number: 702583

## 2020-04-14 NOTE — Progress Notes (Signed)
Remote pacemaker transmission.   

## 2020-05-25 DIAGNOSIS — Z23 Encounter for immunization: Secondary | ICD-10-CM | POA: Diagnosis not present

## 2020-05-25 DIAGNOSIS — M5136 Other intervertebral disc degeneration, lumbar region: Secondary | ICD-10-CM | POA: Diagnosis not present

## 2020-05-25 DIAGNOSIS — M1990 Unspecified osteoarthritis, unspecified site: Secondary | ICD-10-CM | POA: Diagnosis not present

## 2020-05-25 DIAGNOSIS — E7800 Pure hypercholesterolemia, unspecified: Secondary | ICD-10-CM | POA: Diagnosis not present

## 2020-05-25 DIAGNOSIS — I4891 Unspecified atrial fibrillation: Secondary | ICD-10-CM | POA: Diagnosis not present

## 2020-05-25 DIAGNOSIS — K219 Gastro-esophageal reflux disease without esophagitis: Secondary | ICD-10-CM | POA: Diagnosis not present

## 2020-05-25 DIAGNOSIS — D6869 Other thrombophilia: Secondary | ICD-10-CM | POA: Diagnosis not present

## 2020-05-25 DIAGNOSIS — R7309 Other abnormal glucose: Secondary | ICD-10-CM | POA: Diagnosis not present

## 2020-05-25 DIAGNOSIS — I10 Essential (primary) hypertension: Secondary | ICD-10-CM | POA: Diagnosis not present

## 2020-05-25 DIAGNOSIS — Z950 Presence of cardiac pacemaker: Secondary | ICD-10-CM | POA: Diagnosis not present

## 2020-07-13 ENCOUNTER — Ambulatory Visit (INDEPENDENT_AMBULATORY_CARE_PROVIDER_SITE_OTHER): Payer: Medicare Other

## 2020-07-13 DIAGNOSIS — R001 Bradycardia, unspecified: Secondary | ICD-10-CM

## 2020-07-14 ENCOUNTER — Telehealth: Payer: Self-pay | Admitting: Internal Medicine

## 2020-07-14 NOTE — Telephone Encounter (Signed)
Rerouting to device pool.

## 2020-07-14 NOTE — Telephone Encounter (Signed)
LMOVM for pt to send missed transmission. 

## 2020-07-14 NOTE — Telephone Encounter (Signed)
Patient wants to know if we have received her transmission from yesterday home remote check.

## 2020-07-15 NOTE — Telephone Encounter (Signed)
Patient monitor is not working and she has to call [employer] back to let them know she they can send her a new monitor which they are on backorder

## 2020-07-16 LAB — CUP PACEART REMOTE DEVICE CHECK
Battery Remaining Longevity: 30 mo
Battery Remaining Percentage: 53 %
Brady Statistic RA Percent Paced: 5 %
Brady Statistic RV Percent Paced: 0 %
Date Time Interrogation Session: 20211110114800
Implantable Lead Implant Date: 20150818
Implantable Lead Implant Date: 20150818
Implantable Lead Location: 753859
Implantable Lead Location: 753860
Implantable Lead Model: 4135
Implantable Lead Model: 4136
Implantable Lead Serial Number: 29563765
Implantable Lead Serial Number: 29591923
Implantable Pulse Generator Implant Date: 20150818
Lead Channel Impedance Value: 474 Ohm
Lead Channel Impedance Value: 554 Ohm
Lead Channel Pacing Threshold Amplitude: 0.4 V
Lead Channel Pacing Threshold Amplitude: 0.9 V
Lead Channel Pacing Threshold Pulse Width: 0.4 ms
Lead Channel Pacing Threshold Pulse Width: 0.4 ms
Lead Channel Setting Pacing Amplitude: 1.3 V
Lead Channel Setting Pacing Amplitude: 2 V
Lead Channel Setting Pacing Pulse Width: 0.4 ms
Lead Channel Setting Sensing Sensitivity: 2.5 mV
Pulse Gen Serial Number: 702583

## 2020-07-17 NOTE — Progress Notes (Signed)
Remote pacemaker transmission.   

## 2020-08-21 NOTE — Telephone Encounter (Signed)
Transmission received 07/15/20 and patient aware monitor id transmitting.

## 2020-10-12 ENCOUNTER — Ambulatory Visit (INDEPENDENT_AMBULATORY_CARE_PROVIDER_SITE_OTHER): Payer: Medicare Other

## 2020-10-12 DIAGNOSIS — R001 Bradycardia, unspecified: Secondary | ICD-10-CM

## 2020-10-14 LAB — CUP PACEART REMOTE DEVICE CHECK
Battery Remaining Longevity: 24 mo
Battery Remaining Percentage: 44 %
Brady Statistic RA Percent Paced: 5 %
Brady Statistic RV Percent Paced: 0 %
Date Time Interrogation Session: 20220207063300
Implantable Lead Implant Date: 20150818
Implantable Lead Implant Date: 20150818
Implantable Lead Location: 753859
Implantable Lead Location: 753860
Implantable Lead Model: 4135
Implantable Lead Model: 4136
Implantable Lead Serial Number: 29563765
Implantable Lead Serial Number: 29591923
Implantable Pulse Generator Implant Date: 20150818
Lead Channel Impedance Value: 474 Ohm
Lead Channel Impedance Value: 545 Ohm
Lead Channel Pacing Threshold Amplitude: 0.8 V
Lead Channel Pacing Threshold Pulse Width: 0.4 ms
Lead Channel Setting Pacing Amplitude: 1.3 V
Lead Channel Setting Pacing Amplitude: 2 V
Lead Channel Setting Pacing Pulse Width: 0.4 ms
Lead Channel Setting Sensing Sensitivity: 2.5 mV
Pulse Gen Serial Number: 702583

## 2020-10-16 NOTE — Progress Notes (Signed)
Remote pacemaker transmission.   

## 2020-11-20 DIAGNOSIS — K222 Esophageal obstruction: Secondary | ICD-10-CM | POA: Diagnosis not present

## 2020-11-20 DIAGNOSIS — R0989 Other specified symptoms and signs involving the circulatory and respiratory systems: Secondary | ICD-10-CM | POA: Diagnosis not present

## 2020-11-20 DIAGNOSIS — I10 Essential (primary) hypertension: Secondary | ICD-10-CM | POA: Diagnosis not present

## 2020-11-20 DIAGNOSIS — M5136 Other intervertebral disc degeneration, lumbar region: Secondary | ICD-10-CM | POA: Diagnosis not present

## 2020-11-20 DIAGNOSIS — Z0000 Encounter for general adult medical examination without abnormal findings: Secondary | ICD-10-CM | POA: Diagnosis not present

## 2020-11-20 DIAGNOSIS — K219 Gastro-esophageal reflux disease without esophagitis: Secondary | ICD-10-CM | POA: Diagnosis not present

## 2020-11-20 DIAGNOSIS — I4891 Unspecified atrial fibrillation: Secondary | ICD-10-CM | POA: Diagnosis not present

## 2020-11-20 DIAGNOSIS — E7800 Pure hypercholesterolemia, unspecified: Secondary | ICD-10-CM | POA: Diagnosis not present

## 2020-11-20 DIAGNOSIS — R7303 Prediabetes: Secondary | ICD-10-CM | POA: Diagnosis not present

## 2020-11-20 DIAGNOSIS — Z950 Presence of cardiac pacemaker: Secondary | ICD-10-CM | POA: Diagnosis not present

## 2020-11-20 DIAGNOSIS — D6869 Other thrombophilia: Secondary | ICD-10-CM | POA: Diagnosis not present

## 2020-11-20 DIAGNOSIS — M8580 Other specified disorders of bone density and structure, unspecified site: Secondary | ICD-10-CM | POA: Diagnosis not present

## 2020-11-27 DIAGNOSIS — R0989 Other specified symptoms and signs involving the circulatory and respiratory systems: Secondary | ICD-10-CM | POA: Diagnosis not present

## 2020-12-30 DIAGNOSIS — Z23 Encounter for immunization: Secondary | ICD-10-CM | POA: Diagnosis not present

## 2021-01-06 DIAGNOSIS — H40013 Open angle with borderline findings, low risk, bilateral: Secondary | ICD-10-CM | POA: Diagnosis not present

## 2021-01-11 ENCOUNTER — Ambulatory Visit (INDEPENDENT_AMBULATORY_CARE_PROVIDER_SITE_OTHER): Payer: Medicare Other

## 2021-01-11 DIAGNOSIS — R001 Bradycardia, unspecified: Secondary | ICD-10-CM | POA: Diagnosis not present

## 2021-01-11 LAB — CUP PACEART REMOTE DEVICE CHECK
Battery Remaining Longevity: 24 mo
Battery Remaining Percentage: 40 %
Brady Statistic RA Percent Paced: 5 %
Brady Statistic RV Percent Paced: 0 %
Date Time Interrogation Session: 20220509054100
Implantable Lead Implant Date: 20150818
Implantable Lead Implant Date: 20150818
Implantable Lead Location: 753859
Implantable Lead Location: 753860
Implantable Lead Model: 4135
Implantable Lead Model: 4136
Implantable Lead Serial Number: 29563765
Implantable Lead Serial Number: 29591923
Implantable Pulse Generator Implant Date: 20150818
Lead Channel Impedance Value: 469 Ohm
Lead Channel Impedance Value: 540 Ohm
Lead Channel Pacing Threshold Amplitude: 0.4 V
Lead Channel Pacing Threshold Amplitude: 0.9 V
Lead Channel Pacing Threshold Pulse Width: 0.4 ms
Lead Channel Pacing Threshold Pulse Width: 0.4 ms
Lead Channel Setting Pacing Amplitude: 1.4 V
Lead Channel Setting Pacing Amplitude: 2 V
Lead Channel Setting Pacing Pulse Width: 0.4 ms
Lead Channel Setting Sensing Sensitivity: 2.5 mV
Pulse Gen Serial Number: 702583

## 2021-01-29 NOTE — Progress Notes (Signed)
Remote pacemaker transmission.   

## 2021-03-05 ENCOUNTER — Other Ambulatory Visit: Payer: Self-pay | Admitting: Internal Medicine

## 2021-03-05 DIAGNOSIS — Z1231 Encounter for screening mammogram for malignant neoplasm of breast: Secondary | ICD-10-CM

## 2021-04-12 ENCOUNTER — Ambulatory Visit (INDEPENDENT_AMBULATORY_CARE_PROVIDER_SITE_OTHER): Payer: Medicare Other

## 2021-04-12 DIAGNOSIS — Z950 Presence of cardiac pacemaker: Secondary | ICD-10-CM | POA: Diagnosis not present

## 2021-04-13 LAB — CUP PACEART REMOTE DEVICE CHECK
Battery Remaining Longevity: 18 mo
Battery Remaining Percentage: 34 %
Brady Statistic RA Percent Paced: 5 %
Brady Statistic RV Percent Paced: 0 %
Date Time Interrogation Session: 20220808054000
Implantable Lead Implant Date: 20150818
Implantable Lead Implant Date: 20150818
Implantable Lead Location: 753859
Implantable Lead Location: 753860
Implantable Lead Model: 4135
Implantable Lead Model: 4136
Implantable Lead Serial Number: 29563765
Implantable Lead Serial Number: 29591923
Implantable Pulse Generator Implant Date: 20150818
Lead Channel Impedance Value: 472 Ohm
Lead Channel Impedance Value: 536 Ohm
Lead Channel Pacing Threshold Amplitude: 0.9 V
Lead Channel Pacing Threshold Pulse Width: 0.4 ms
Lead Channel Setting Pacing Amplitude: 1.4 V
Lead Channel Setting Pacing Amplitude: 2 V
Lead Channel Setting Pacing Pulse Width: 0.4 ms
Lead Channel Setting Sensing Sensitivity: 2.5 mV
Pulse Gen Serial Number: 702583

## 2021-04-29 ENCOUNTER — Ambulatory Visit
Admission: RE | Admit: 2021-04-29 | Discharge: 2021-04-29 | Disposition: A | Discharge status: Home / Self Care | Payer: Medicare Other | Source: Ambulatory Visit | Attending: Internal Medicine | Admitting: Internal Medicine

## 2021-04-29 ENCOUNTER — Ambulatory Visit: Payer: Medicare Other

## 2021-04-29 ENCOUNTER — Other Ambulatory Visit: Payer: Self-pay

## 2021-04-29 DIAGNOSIS — Z1231 Encounter for screening mammogram for malignant neoplasm of breast: Secondary | ICD-10-CM

## 2021-04-30 ENCOUNTER — Other Ambulatory Visit: Payer: Self-pay | Admitting: Internal Medicine

## 2021-04-30 DIAGNOSIS — M8580 Other specified disorders of bone density and structure, unspecified site: Secondary | ICD-10-CM

## 2021-05-05 NOTE — Progress Notes (Signed)
Remote pacemaker transmission.   

## 2021-05-06 ENCOUNTER — Ambulatory Visit
Admission: RE | Admit: 2021-05-06 | Discharge: 2021-05-06 | Disposition: A | Discharge status: Home / Self Care | Payer: Medicare Other | Source: Ambulatory Visit | Attending: Internal Medicine | Admitting: Internal Medicine

## 2021-05-06 ENCOUNTER — Other Ambulatory Visit: Payer: Self-pay

## 2021-05-06 DIAGNOSIS — Z780 Asymptomatic menopausal state: Secondary | ICD-10-CM | POA: Diagnosis not present

## 2021-05-06 DIAGNOSIS — M85852 Other specified disorders of bone density and structure, left thigh: Secondary | ICD-10-CM | POA: Diagnosis not present

## 2021-05-06 DIAGNOSIS — M810 Age-related osteoporosis without current pathological fracture: Secondary | ICD-10-CM | POA: Diagnosis not present

## 2021-05-06 DIAGNOSIS — M8580 Other specified disorders of bone density and structure, unspecified site: Secondary | ICD-10-CM

## 2021-05-11 DIAGNOSIS — D6869 Other thrombophilia: Secondary | ICD-10-CM | POA: Diagnosis not present

## 2021-05-11 DIAGNOSIS — E7800 Pure hypercholesterolemia, unspecified: Secondary | ICD-10-CM | POA: Diagnosis not present

## 2021-05-11 DIAGNOSIS — I10 Essential (primary) hypertension: Secondary | ICD-10-CM | POA: Diagnosis not present

## 2021-05-11 DIAGNOSIS — I4891 Unspecified atrial fibrillation: Secondary | ICD-10-CM | POA: Diagnosis not present

## 2021-05-11 DIAGNOSIS — M810 Age-related osteoporosis without current pathological fracture: Secondary | ICD-10-CM | POA: Diagnosis not present

## 2021-06-16 DIAGNOSIS — Z23 Encounter for immunization: Secondary | ICD-10-CM | POA: Diagnosis not present

## 2021-07-12 ENCOUNTER — Ambulatory Visit (INDEPENDENT_AMBULATORY_CARE_PROVIDER_SITE_OTHER): Payer: Medicare Other

## 2021-07-12 DIAGNOSIS — Z950 Presence of cardiac pacemaker: Secondary | ICD-10-CM | POA: Diagnosis not present

## 2021-07-12 LAB — CUP PACEART REMOTE DEVICE CHECK
Battery Remaining Longevity: 18 mo
Battery Remaining Percentage: 26 %
Brady Statistic RA Percent Paced: 5 %
Brady Statistic RV Percent Paced: 0 %
Date Time Interrogation Session: 20221107054200
Implantable Lead Implant Date: 20150818
Implantable Lead Implant Date: 20150818
Implantable Lead Location: 753859
Implantable Lead Location: 753860
Implantable Lead Model: 4135
Implantable Lead Model: 4136
Implantable Lead Serial Number: 29563765
Implantable Lead Serial Number: 29591923
Implantable Pulse Generator Implant Date: 20150818
Lead Channel Impedance Value: 471 Ohm
Lead Channel Impedance Value: 543 Ohm
Lead Channel Pacing Threshold Amplitude: 0.4 V
Lead Channel Pacing Threshold Amplitude: 0.8 V
Lead Channel Pacing Threshold Pulse Width: 0.4 ms
Lead Channel Pacing Threshold Pulse Width: 0.4 ms
Lead Channel Setting Pacing Amplitude: 1.3 V
Lead Channel Setting Pacing Amplitude: 2 V
Lead Channel Setting Pacing Pulse Width: 0.4 ms
Lead Channel Setting Sensing Sensitivity: 2.5 mV
Pulse Gen Serial Number: 702583

## 2021-07-14 DIAGNOSIS — H26493 Other secondary cataract, bilateral: Secondary | ICD-10-CM | POA: Diagnosis not present

## 2021-07-16 NOTE — Progress Notes (Signed)
Remote pacemaker transmission.   

## 2021-10-11 ENCOUNTER — Ambulatory Visit (INDEPENDENT_AMBULATORY_CARE_PROVIDER_SITE_OTHER): Payer: Medicare Other

## 2021-10-11 DIAGNOSIS — Z950 Presence of cardiac pacemaker: Secondary | ICD-10-CM

## 2021-10-11 LAB — CUP PACEART REMOTE DEVICE CHECK
Battery Remaining Longevity: 12 mo
Battery Remaining Percentage: 23 %
Brady Statistic RA Percent Paced: 5 %
Brady Statistic RV Percent Paced: 0 %
Date Time Interrogation Session: 20230206054000
Implantable Lead Implant Date: 20150818
Implantable Lead Implant Date: 20150818
Implantable Lead Location: 753859
Implantable Lead Location: 753860
Implantable Lead Model: 4135
Implantable Lead Model: 4136
Implantable Lead Serial Number: 29563765
Implantable Lead Serial Number: 29591923
Implantable Pulse Generator Implant Date: 20150818
Lead Channel Impedance Value: 480 Ohm
Lead Channel Impedance Value: 548 Ohm
Lead Channel Pacing Threshold Amplitude: 0.4 V
Lead Channel Pacing Threshold Amplitude: 0.9 V
Lead Channel Pacing Threshold Pulse Width: 0.4 ms
Lead Channel Pacing Threshold Pulse Width: 0.4 ms
Lead Channel Setting Pacing Amplitude: 1.4 V
Lead Channel Setting Pacing Amplitude: 2 V
Lead Channel Setting Pacing Pulse Width: 0.4 ms
Lead Channel Setting Sensing Sensitivity: 2.5 mV
Pulse Gen Serial Number: 702583

## 2021-10-13 NOTE — Progress Notes (Signed)
Remote pacemaker transmission.   

## 2021-11-17 DIAGNOSIS — H40053 Ocular hypertension, bilateral: Secondary | ICD-10-CM | POA: Diagnosis not present

## 2021-11-24 DIAGNOSIS — Z1389 Encounter for screening for other disorder: Secondary | ICD-10-CM | POA: Diagnosis not present

## 2021-11-24 DIAGNOSIS — M810 Age-related osteoporosis without current pathological fracture: Secondary | ICD-10-CM | POA: Diagnosis not present

## 2021-11-24 DIAGNOSIS — I10 Essential (primary) hypertension: Secondary | ICD-10-CM | POA: Diagnosis not present

## 2021-11-24 DIAGNOSIS — Z950 Presence of cardiac pacemaker: Secondary | ICD-10-CM | POA: Diagnosis not present

## 2021-11-24 DIAGNOSIS — M1990 Unspecified osteoarthritis, unspecified site: Secondary | ICD-10-CM | POA: Diagnosis not present

## 2021-11-24 DIAGNOSIS — E7800 Pure hypercholesterolemia, unspecified: Secondary | ICD-10-CM | POA: Diagnosis not present

## 2021-11-24 DIAGNOSIS — Z0000 Encounter for general adult medical examination without abnormal findings: Secondary | ICD-10-CM | POA: Diagnosis not present

## 2021-11-24 DIAGNOSIS — K219 Gastro-esophageal reflux disease without esophagitis: Secondary | ICD-10-CM | POA: Diagnosis not present

## 2021-11-24 DIAGNOSIS — R7303 Prediabetes: Secondary | ICD-10-CM | POA: Diagnosis not present

## 2021-11-24 DIAGNOSIS — M5136 Other intervertebral disc degeneration, lumbar region: Secondary | ICD-10-CM | POA: Diagnosis not present

## 2021-11-24 DIAGNOSIS — D6869 Other thrombophilia: Secondary | ICD-10-CM | POA: Diagnosis not present

## 2021-11-24 DIAGNOSIS — K222 Esophageal obstruction: Secondary | ICD-10-CM | POA: Diagnosis not present

## 2021-11-24 DIAGNOSIS — R4789 Other speech disturbances: Secondary | ICD-10-CM | POA: Diagnosis not present

## 2021-11-24 DIAGNOSIS — I4891 Unspecified atrial fibrillation: Secondary | ICD-10-CM | POA: Diagnosis not present

## 2021-12-03 DIAGNOSIS — R4789 Other speech disturbances: Secondary | ICD-10-CM | POA: Diagnosis not present

## 2021-12-03 DIAGNOSIS — R479 Unspecified speech disturbances: Secondary | ICD-10-CM | POA: Diagnosis not present

## 2022-01-05 DIAGNOSIS — U071 COVID-19: Secondary | ICD-10-CM | POA: Diagnosis not present

## 2022-01-10 ENCOUNTER — Ambulatory Visit (INDEPENDENT_AMBULATORY_CARE_PROVIDER_SITE_OTHER): Payer: Medicare Other

## 2022-01-10 DIAGNOSIS — Z950 Presence of cardiac pacemaker: Secondary | ICD-10-CM

## 2022-01-10 LAB — CUP PACEART REMOTE DEVICE CHECK
Battery Remaining Longevity: 10 mo
Battery Remaining Percentage: 15 %
Brady Statistic RA Percent Paced: 5 %
Brady Statistic RV Percent Paced: 0 %
Date Time Interrogation Session: 20230508054100
Implantable Lead Implant Date: 20150818
Implantable Lead Implant Date: 20150818
Implantable Lead Location: 753859
Implantable Lead Location: 753860
Implantable Lead Model: 4135
Implantable Lead Model: 4136
Implantable Lead Serial Number: 29563765
Implantable Lead Serial Number: 29591923
Implantable Pulse Generator Implant Date: 20150818
Lead Channel Impedance Value: 468 Ohm
Lead Channel Impedance Value: 538 Ohm
Lead Channel Pacing Threshold Amplitude: 0.4 V
Lead Channel Pacing Threshold Amplitude: 0.9 V
Lead Channel Pacing Threshold Pulse Width: 0.4 ms
Lead Channel Pacing Threshold Pulse Width: 0.4 ms
Lead Channel Setting Pacing Amplitude: 1.4 V
Lead Channel Setting Pacing Amplitude: 2 V
Lead Channel Setting Pacing Pulse Width: 0.4 ms
Lead Channel Setting Sensing Sensitivity: 2.5 mV
Pulse Gen Serial Number: 702583

## 2022-01-19 DIAGNOSIS — H40053 Ocular hypertension, bilateral: Secondary | ICD-10-CM | POA: Diagnosis not present

## 2022-01-26 NOTE — Progress Notes (Signed)
Remote pacemaker transmission.   

## 2022-03-24 ENCOUNTER — Telehealth: Payer: Self-pay | Admitting: Internal Medicine

## 2022-03-24 NOTE — Telephone Encounter (Signed)
Patient is going away on vacation she wants to know if she needs to take her transmission box with her.

## 2022-03-24 NOTE — Telephone Encounter (Signed)
Spoke to patient who states she will be out of town for >2 weeks but her son does not have land line. Advised patient her device will still work without her remote monitor so do not worry about taking since he does not have a land line. Patient will have remote when she returns home. Patient appreciative for call.

## 2022-04-11 ENCOUNTER — Ambulatory Visit (INDEPENDENT_AMBULATORY_CARE_PROVIDER_SITE_OTHER): Payer: Medicare Other

## 2022-04-11 DIAGNOSIS — R55 Syncope and collapse: Secondary | ICD-10-CM

## 2022-04-12 LAB — CUP PACEART REMOTE DEVICE CHECK
Battery Remaining Longevity: 8 mo
Battery Remaining Percentage: 12 %
Brady Statistic RA Percent Paced: 5 %
Brady Statistic RV Percent Paced: 0 %
Date Time Interrogation Session: 20230807054100
Implantable Lead Implant Date: 20150818
Implantable Lead Implant Date: 20150818
Implantable Lead Location: 753859
Implantable Lead Location: 753860
Implantable Lead Model: 4135
Implantable Lead Model: 4136
Implantable Lead Serial Number: 29563765
Implantable Lead Serial Number: 29591923
Implantable Pulse Generator Implant Date: 20150818
Lead Channel Impedance Value: 475 Ohm
Lead Channel Impedance Value: 546 Ohm
Lead Channel Pacing Threshold Amplitude: 0.5 V
Lead Channel Pacing Threshold Amplitude: 0.9 V
Lead Channel Pacing Threshold Pulse Width: 0.4 ms
Lead Channel Pacing Threshold Pulse Width: 0.4 ms
Lead Channel Setting Pacing Amplitude: 1.4 V
Lead Channel Setting Pacing Amplitude: 2 V
Lead Channel Setting Pacing Pulse Width: 0.4 ms
Lead Channel Setting Sensing Sensitivity: 2.5 mV
Pulse Gen Serial Number: 702583

## 2022-04-25 ENCOUNTER — Other Ambulatory Visit: Payer: Self-pay | Admitting: Internal Medicine

## 2022-04-25 DIAGNOSIS — Z1231 Encounter for screening mammogram for malignant neoplasm of breast: Secondary | ICD-10-CM

## 2022-05-03 ENCOUNTER — Telehealth: Payer: Self-pay

## 2022-05-03 NOTE — Telephone Encounter (Signed)
Patient called in wanting someone help her interpret her results. I let patient know a nurse will give her a call back

## 2022-05-03 NOTE — Telephone Encounter (Signed)
Spoke with patient informed her that she had 8 months left on her device until ERI patient voiced understanding

## 2022-05-10 ENCOUNTER — Encounter: Payer: Self-pay | Admitting: Internal Medicine

## 2022-05-10 ENCOUNTER — Ambulatory Visit: Payer: Medicare Other | Attending: Internal Medicine | Admitting: Internal Medicine

## 2022-05-10 VITALS — BP 122/70 | HR 65 | Ht 68.0 in | Wt 161.2 lb

## 2022-05-10 DIAGNOSIS — R55 Syncope and collapse: Secondary | ICD-10-CM | POA: Insufficient documentation

## 2022-05-10 DIAGNOSIS — Z950 Presence of cardiac pacemaker: Secondary | ICD-10-CM | POA: Diagnosis not present

## 2022-05-10 NOTE — Progress Notes (Signed)
Patient Care Team: Wenda Low, MD as PCP - General (Internal Medicine)   HPI  Bianca Contreras is a 81 y.o. female Seen in follow-up for pacemaker implanted 2015 elsewhere-[facility].  Approaching ERI.  Also atrial fibrillation by outside report.  On Eliquis  She had a history of remote syncope but has had none since.  My reading of the notes suggested that it may well have been neurally mediated.  Cardiac evaluation 2015 included a calcium score that was abnormal catheterization that was nonobstructive.  Echocardiogram normal  The patient denies chest pain,  , nocturnal dysp, orthopnea or peripheral edema.  There have been no palpitations, lightheadedness or syncope.  Complains of dypsnea on exertion esp on stairs   It will episode of difficulty finding words that lasted about 2 minutes thought by her PCP to possibly be TIA  On anticoagulation with apixaban without bleeding  Date Cr K Hgb  3/23 0.85 4.6 12.2         .   History reviewed. No pertinent past medical history.  Past Surgical History:  Procedure Laterality Date   BREAST BIOPSY     25 years ago cyst biopsy   INSERT / REPLACE / REMOVE PACEMAKER      Current Outpatient Medications  Medication Sig Dispense Refill   alendronate (FOSAMAX) 70 MG tablet Take 70 mg by mouth once a week.     amLODipine (NORVASC) 2.5 MG tablet Take 1 tablet by mouth daily.     Ascorbic Acid (VITAMIN C) 1000 MG tablet Take 2,000 mg by mouth daily.      Calcium Carb-Cholecalciferol (CALCIUM-VITAMIN D) 500-200 MG-UNIT tablet Take 1 tablet by mouth daily.     ELIQUIS 5 MG TABS tablet Take 1 tablet by mouth 2 (two) times daily.     fluticasone (FLONASE) 50 MCG/ACT nasal spray Place 2 sprays into both nostrils daily as needed for allergies or rhinitis.     Loratadine 10 MG CAPS Take 10 mg by mouth daily.     metoprolol tartrate (LOPRESSOR) 50 MG tablet Take 1 tablet by mouth 2 (two) times daily.     mometasone  (ELOCON) 0.1 % cream Apply 1 application topically daily as needed (for dry skin).     omeprazole (PRILOSEC) 20 MG capsule Take 1 capsule by mouth 2 (two) times daily.      rosuvastatin (CRESTOR) 5 MG tablet Take 1 tablet by mouth daily.     vitamin B-12 (CYANOCOBALAMIN) 1000 MCG tablet Take 6,000 mcg by mouth daily.      No current facility-administered medications for this visit.    No Known Allergies    Review of Systems negative except from HPI and PMH  Physical Exam BP 122/70   Pulse 65   Ht '5\' 8"'$  (1.727 m)   Wt 161 lb 3.2 oz (73.1 kg)   SpO2 97%   BMI 24.51 kg/m    Well developed and well nourished in no acute distress HENT normal Neck supple with JVP-flat Clear Device pocket well healed; without hematoma or erythema.  There is no tethering  Regular rate and rhythm, no murmur Abd-soft with active BS No Clubbing cyanosis  edema Skin-warm and dry A & Oriented  Grossly normal sensory and motor function  ECG sinus at 65 Interval 17/09/43 Otherwise normal   Assessment and  Plan  Syncope-probably neurally mediated   Dyspnea on exertion   Pacemaker-St. Jude  Atrial fibrillation by outside report  No interval syncope.  Interval episode of word finding difficulties.  Question TIA.  No bleeding on her Eliquis.  We will continue at 5 twice daily dosed appropriately with weight and renal function.  Dyspnea on stairs, multiple efforts were tried to reprogram her device to become more aggressive to help with chronotropic incompetence.  Have elected to leave it where it is after all those efforts. \

## 2022-05-10 NOTE — Patient Instructions (Signed)
Medication Instructions:  Your physician recommends that you continue on your current medications as directed. Please refer to the Current Medication list given to you today.     *If you need a refill on your cardiac medications before your next appointment, please call your pharmacy*   Lab Work: None ordered.  If you have labs (blood work) drawn today and your tests are completely normal, you will receive your results only by: [place] (if you have MyChart) OR A paper copy in the mail If you have any lab test that is abnormal or we need to change your treatment, we will call you to review the results.   Testing/Procedures: None ordered.    Follow-Up: At [facility], you and your health needs are our priority.  As part of our continuing mission to provide you with exceptional heart care, we have created designated Provider Care Teams.  These Care Teams include your primary Cardiologist (physician) and Advanced Practice Providers (APPs -  Physician Assistants and Nurse Practitioners) who all work together to provide you with the care you need, when you need it.  We recommend signing up for the patient portal called "MyChart".  Sign up information is provided on this After Visit Summary.  MyChart is used to connect with patients for Virtual Visits (Telemedicine).  Patients are able to view lab/test results, encounter notes, upcoming appointments, etc.  Non-urgent messages can be sent to your provider as well.   To learn more about what you can do with MyChart, go to [email or web address].    Your next appointment:   5 months with Dr Olin Pia PA  Important Information About [place]

## 2022-05-11 NOTE — Progress Notes (Signed)
Remote pacemaker transmission.   

## 2022-05-18 ENCOUNTER — Ambulatory Visit
Admission: RE | Admit: 2022-05-18 | Discharge: 2022-05-18 | Disposition: A | Discharge status: Home / Self Care | Payer: Medicare Other | Source: Ambulatory Visit | Attending: Internal Medicine | Admitting: Internal Medicine

## 2022-05-18 DIAGNOSIS — Z1231 Encounter for screening mammogram for malignant neoplasm of breast: Secondary | ICD-10-CM | POA: Diagnosis not present

## 2022-05-25 DIAGNOSIS — R7303 Prediabetes: Secondary | ICD-10-CM | POA: Diagnosis not present

## 2022-05-25 DIAGNOSIS — M810 Age-related osteoporosis without current pathological fracture: Secondary | ICD-10-CM | POA: Diagnosis not present

## 2022-05-25 DIAGNOSIS — I10 Essential (primary) hypertension: Secondary | ICD-10-CM | POA: Diagnosis not present

## 2022-05-25 DIAGNOSIS — E7800 Pure hypercholesterolemia, unspecified: Secondary | ICD-10-CM | POA: Diagnosis not present

## 2022-05-25 DIAGNOSIS — Z950 Presence of cardiac pacemaker: Secondary | ICD-10-CM | POA: Diagnosis not present

## 2022-05-25 DIAGNOSIS — I4891 Unspecified atrial fibrillation: Secondary | ICD-10-CM | POA: Diagnosis not present

## 2022-05-25 DIAGNOSIS — R2689 Other abnormalities of gait and mobility: Secondary | ICD-10-CM | POA: Diagnosis not present

## 2022-05-25 DIAGNOSIS — K219 Gastro-esophageal reflux disease without esophagitis: Secondary | ICD-10-CM | POA: Diagnosis not present

## 2022-05-25 DIAGNOSIS — D6869 Other thrombophilia: Secondary | ICD-10-CM | POA: Diagnosis not present

## 2022-05-25 DIAGNOSIS — M5136 Other intervertebral disc degeneration, lumbar region: Secondary | ICD-10-CM | POA: Diagnosis not present

## 2022-06-09 DIAGNOSIS — Z23 Encounter for immunization: Secondary | ICD-10-CM | POA: Diagnosis not present

## 2022-06-15 ENCOUNTER — Encounter: Payer: Self-pay | Admitting: *Deleted

## 2022-06-16 ENCOUNTER — Ambulatory Visit (INDEPENDENT_AMBULATORY_CARE_PROVIDER_SITE_OTHER): Payer: Medicare Other | Admitting: Neurology

## 2022-06-16 ENCOUNTER — Telehealth: Payer: Self-pay | Admitting: Neurology

## 2022-06-16 ENCOUNTER — Encounter: Payer: Self-pay | Admitting: Neurology

## 2022-06-16 VITALS — BP 151/74 | HR 68 | Ht 68.0 in | Wt 159.6 lb

## 2022-06-16 DIAGNOSIS — R2689 Other abnormalities of gait and mobility: Secondary | ICD-10-CM | POA: Diagnosis not present

## 2022-06-16 NOTE — Telephone Encounter (Signed)
medicare/[employer] sup NPR sent to GI [phone] 

## 2022-06-16 NOTE — Progress Notes (Signed)
Subjective:    Patient ID: Bianca Contreras is a 81 y.o. female.  HPI    Star Age, MD, PhD [facility] [address], [address] [place], [place] [address]  Dear Dr. Lysle Rubens,  I saw your patient, Bianca Contreras, upon your kind request in my neurologic clinic today for initial consultation for balance problems.  The patient is unaccompanied today.  As you know, Bianca Contreras is an 81 year old right-handed woman with an underlying medical history of hypertension, hyperlipidemia, atrial fibrillation, status post pacemaker placement (2015), prediabetes, allergic rhinitis, vertigo, degenerative lumbar disc disease, reflux disease, osteoarthritis, osteoporosis, and borderline overweight state, who reports a 2 to 39-monthhistory of balance issues.  She herself had not noticed much in the way of a problem but [place] noticed that she was not walking very steadily.  She has not fallen thankfully.  She does not have any sudden onset of one-sided weakness or numbness or tingling or droopy face or slurring of speech.  Several months ago she had 1 minute episode of blurry vision, she had her eyes checked out by her eye doctor and was told by her eye doctor that she may have had a TIA.  She did not have any one-sided symptoms at the time, and denies any aphasia.  She is a retired sAstronomer  She has a remote history of vertigo in 2011, she had exercises for this, her symptoms do not resemble any vertiginous issues.  She also had remote issues with her balance several years ago in 2008 in the context of a sinus infection and she was checked by ENT and treated at the time.  Her symptoms are not resembling those issues.  She has had higher blood pressure values.  She denies any lightheadedness.   I reviewed your office note from 05/25/2022.  Per your records reportedly had a 1 minute episode of word finding difficulty in March  2023.  She reports that she could not see the words properly due to blurry vision.   She had an office visit on 12/03/2021.  She had a carotid Doppler ultrasound on 12/03/2021 and I reviewed the results in your office visit note from 12/03/2021, indicating no significant blockage.  She is followed by cardiology.  She is on Eliquis.  She had blood work through your office on 11/24/2021 and I reviewed the results: Lipid panel showed total cholesterol 163, LDL 85, triglycerides 191, A1c was 6.0, vitamin D51.4, TSH 1.21, CMP showed benign findings, BUN was 13, creatinine 0.85, CBC without differential showed benign findings.    She is a non-smoker, she drinks alcohol rarely, she drinks warm and large cups, several a day, she estimates about 3 larger cups per day.  She is widowed, she lives alone, [place] is locally here, patient drives without difficulty.  She exercises regularly in the form of walking.  Her Past Medical History Is Significant For: Past Medical History:  Diagnosis Date   A-fib ([place]    Allergic rhinitis    DDD (degenerative disc disease), lumbar    Exertional dyspnea    GERD (gastroesophageal reflux disease)    High cholesterol    Hypertension    Osteoarthritis    Osteoporosis    Pacemaker    Prediabetes    Schatzki's ring     Her Past Surgical History Is Significant For: Past Surgical History:  Procedure Laterality Date  BREAST BIOPSY     25 years ago cyst biopsy   INSERT / REPLACE / REMOVE PACEMAKER      Her Family History Is Significant For: Family History  Problem Relation Age of Onset   Heart attack Mother    Tuberculosis Mother    Cancer Father    Heart failure Maternal Grandmother    Breast cancer Cousin        47s    Her Social History Is Significant For: Social History   Socioeconomic History   Marital status: Widowed    Spouse name: Not on file   Number of children: Not on file   Years of education: Not on file    Highest education level: Not on file  Occupational History   Not on file  Tobacco Use   Smoking status: Former   Smokeless tobacco: Never  Vaping Use   Vaping Use: Never used  Substance and Sexual Activity   Alcohol use: Yes    Comment: occasionally/socially 1 every 70mo   Drug use: No   Sexual activity: Yes  Other Topics Concern   Not on file  Social History Narrative   Caffeine 2 cups per day    Lives home alone.   Widowed   2 grown children   MS level (college)   Social Determinants of Health   Financial Resource Strain: Not on file  Food Insecurity: Not on file  Transportation Needs: Not on file  Physical Activity: Not on file  Stress: Not on file  Social Connections: Not on file    Her Allergies Are:  No Known Allergies:   Her Current Medications Are:  Outpatient Encounter Medications as of 06/16/2022  Medication Sig   alendronate (FOSAMAX) 70 MG tablet Take 70 mg by mouth once a week.   amLODipine (NORVASC) 2.5 MG tablet Take 1 tablet by mouth daily.   Ascorbic Acid (VITAMIN C) 1000 MG tablet Take 1,000 mg by mouth daily.   Calcium Carb-Cholecalciferol (CALCIUM-VITAMIN D) 500-200 MG-UNIT tablet Take 1 tablet by mouth daily.   cetirizine (ZYRTEC) 10 MG tablet Take 10 mg by mouth daily.   Cholecalciferol (VITAMIN D) 50 MCG (2000 UT) CAPS Take 1 capsule by mouth daily at 6 (six) AM.   ELIQUIS 5 MG TABS tablet Take 1 tablet by mouth 2 (two) times daily.   fluticasone (FLONASE) 50 MCG/ACT nasal spray Place 2 sprays into both nostrils daily as needed for allergies or rhinitis.   GINKGO BILOBA PO Take 60 mg by mouth daily at 6 (six) AM.   metoprolol tartrate (LOPRESSOR) 50 MG tablet Take 1 tablet by mouth 2 (two) times daily.   mometasone (ELOCON) 0.1 % cream Apply 1 application topically daily as needed (for dry skin).   omeprazole (PRILOSEC) 20 MG capsule Take 1 capsule by mouth 2 (two) times daily.    rosuvastatin (CRESTOR) 5 MG tablet Take 1 tablet by mouth  daily.   vitamin B-12 (CYANOCOBALAMIN) 1000 MCG tablet Take 6,000 mcg by mouth daily.    No facility-administered encounter medications on file as of 06/16/2022.  :   Review of Systems:  Out of a complete 14 point review of systems, all are reviewed and negative with the exception of these symptoms as listed below:  Review of Systems  Neurological:        Balance / falls.  Had ?TIA in 11/2021. Since last vacation with son, in 04/2022 having issues with balance.     Objective:  Neurological Exam  Physical  Exam Physical Examination:   Vitals:   06/16/22 0806  BP: (!) 151/74  Pulse: 68    General Examination: The patient is a very pleasant 81 y.o. female in no acute distress. She appears well-developed and well-nourished and well groomed.   HEENT: Normocephalic, atraumatic, pupils are equal, round and reactive to light, extraocular tracking is good without limitation to gaze excursion or nystagmus noted.  Corrective eyeglasses in place.  Hearing is grossly intact. Face is symmetric with normal facial animation. Speech is clear with no dysarthria noted. There is no hypophonia. There is no lip, neck/head, jaw or voice tremor. Neck is supple with full range of passive and active motion. There are no carotid bruits on auscultation. Oropharynx exam reveals: Mild to moderate mouth dryness, adequate dental hygiene.  Tongue protrudes centrally and palate elevates symmetrically.  No vertiginous symptoms upon head turns.    Chest: Clear to auscultation without wheezing, rhonchi or crackles noted.  Heart: S1+S2+0, regular and normal without murmurs, rubs or gallops noted.   Abdomen: Soft, non-tender and non-distended.  Extremities: There is no pitting edema in the distal lower extremities bilaterally.   Skin: Warm and dry without trophic changes noted.   Musculoskeletal: exam reveals no obvious joint deformities.   Neurologically:  Mental status: The patient is awake, alert and oriented  in all 4 spheres. Her immediate and remote memory, attention, language skills and fund of knowledge are appropriate. There is no evidence of aphasia, agnosia, apraxia or anomia. Speech is clear with normal prosody and enunciation. Thought process is linear. Mood is normal and affect is normal.  Cranial nerves II - XII are as described above under HEENT exam.  Motor exam: Normal bulk, strength and tone is noted. There is no obvious action or resting tremor.  Fine motor skills and coordination: Normal finger taps, hand movements and rapid alternating patting in the upper extremities.   Cerebellar testing: No dysmetria or intention tremor. There is no truncal or gait ataxia.  Normal finger-to-nose and normal heel-to-shin. Sensory exam: intact to light touch in the upper and lower extremities.  Gait, station and balance: She stands easily. No veering to one side is noted. No leaning to one side is noted. Posture is age-appropriate and stance is narrow based. Gait shows normal stride length and normal pace. No problems turning are noted.   Assessment and Plan:  In summary, Bianca Contreras is a very pleasant 81 y.o.-year old female with an underlying medical history of hypertension, hyperlipidemia, atrial fibrillation, status post pacemaker placement (2015), prediabetes, allergic rhinitis, vertigo, degenerative lumbar disc disease, reflux disease, osteoarthritis, osteoporosis, and borderline overweight state, who presents for evaluation of her balance disturbance of about 2 to 3 months duration.  She denies any lightheadedness, no vertigo, no sudden onset of strokelike symptoms, no recent falls. Examination is benign, in particular, no evidence of vertigo, no ataxia, no tremors, no parkinsonism.  She is largely reassured today.  She is advised to continue to pursue a healthy lifestyle and use a cane for gait safety when she knows that she is going to be out and about and walk a longer distance.  She is  advised to proceed with a head CT without contrast to rule out an obvious structural cause of her symptoms, the presence of her pacemaker excludes her from an MRI, her pacemaker ID card does not state that it is MRI compatible.  So long as her head CT is nonrevealing and age-appropriate, we will call her with her  results and she can follow-up with you and her other specialists as scheduled.  We will not need a follow-up appointment at this juncture in this clinic.  I question if she was in agreement with our approach.  Thank you very much for allowing me to participate in the care of this nice patient. If I can be of any further assistance to you please do not hesitate to call me at [phone].  Sincerely,   Star Age, MD, PhD

## 2022-06-16 NOTE — Patient Instructions (Signed)
It was nice to meet you today.  Your balance is not particularly impaired, if you feel slightly off with your balance from time to time, it may be worth using a cane if you know that you are going to be out and about or walk a longer distance.  You do not have any signs of parkinsonism, no tremor, no one-sided symptoms such as stroke. Nevertheless, I would like to go ahead with a CT of your head to rule out any structural cause if possible.  Unfortunately, given your pacemaker you cannot have a brain MRI.  Your pacemaker ID card does not indicate that your pacemaker is MRI compatible.   So long as your CT scan results are reassuring and age-appropriate, we can call you with the results and you can follow-up with your primary care and other specialists as scheduled.  We do not need to make a follow-up appointment in my clinic at this time.

## 2022-06-16 NOTE — Addendum Note (Signed)
Addended by: Star Age on: 06/16/2022 09:09 AM   Modules accepted: Orders

## 2022-07-06 ENCOUNTER — Ambulatory Visit
Admission: RE | Admit: 2022-07-06 | Discharge: 2022-07-06 | Disposition: A | Discharge status: Home / Self Care | Payer: Medicare Other | Source: Ambulatory Visit | Attending: Neurology | Admitting: Neurology

## 2022-07-06 DIAGNOSIS — R2689 Other abnormalities of gait and mobility: Secondary | ICD-10-CM

## 2022-07-11 ENCOUNTER — Ambulatory Visit (INDEPENDENT_AMBULATORY_CARE_PROVIDER_SITE_OTHER): Payer: Medicare Other

## 2022-07-11 DIAGNOSIS — R55 Syncope and collapse: Secondary | ICD-10-CM

## 2022-07-12 ENCOUNTER — Telehealth: Payer: Self-pay | Admitting: *Deleted

## 2022-07-12 LAB — CUP PACEART REMOTE DEVICE CHECK
Battery Remaining Longevity: 4 mo
Battery Remaining Percentage: 6 %
Brady Statistic RA Percent Paced: 6 %
Brady Statistic RV Percent Paced: 0 %
Date Time Interrogation Session: 20231106054100
Implantable Lead Connection Status: 753985
Implantable Lead Connection Status: 753985
Implantable Lead Implant Date: 20150818
Implantable Lead Implant Date: 20150818
Implantable Lead Location: 753859
Implantable Lead Location: 753860
Implantable Lead Model: 4135
Implantable Lead Model: 4136
Implantable Lead Serial Number: 29563765
Implantable Lead Serial Number: 29591923
Implantable Pulse Generator Implant Date: 20150818
Lead Channel Impedance Value: 471 Ohm
Lead Channel Impedance Value: 541 Ohm
Lead Channel Pacing Threshold Amplitude: 0.4 V
Lead Channel Pacing Threshold Amplitude: 0.9 V
Lead Channel Pacing Threshold Pulse Width: 0.4 ms
Lead Channel Pacing Threshold Pulse Width: 0.4 ms
Lead Channel Setting Pacing Amplitude: 1.4 V
Lead Channel Setting Pacing Amplitude: 2 V
Lead Channel Setting Pacing Pulse Width: 0.4 ms
Lead Channel Setting Sensing Sensitivity: 2.5 mV
Pulse Gen Serial Number: 702583
Zone Setting Status: 755011

## 2022-07-12 NOTE — Telephone Encounter (Signed)
Spoke with patient gave CT results Pt expressed understanding and asked for  CT results to be sent to PCP. Pt thanked me for calling . Per pt request forward CT results to PCP

## 2022-07-12 NOTE — Telephone Encounter (Signed)
-----   Message from Star Age, MD sent at 07/11/2022 10:23 AM EST ----- Please call patient and advise her that her CT scan of the head showed no acute findings and no obvious cause for balance issues or gait abnormality.

## 2022-07-13 DIAGNOSIS — H40053 Ocular hypertension, bilateral: Secondary | ICD-10-CM | POA: Diagnosis not present

## 2022-08-10 NOTE — Progress Notes (Signed)
Remote pacemaker transmission.   

## 2022-08-10 NOTE — Addendum Note (Signed)
Addended by: Douglass Rivers D on: 08/10/2022 04:58 PM   Modules accepted: Level of Service

## 2022-08-11 ENCOUNTER — Ambulatory Visit (INDEPENDENT_AMBULATORY_CARE_PROVIDER_SITE_OTHER): Payer: Medicare Other

## 2022-08-11 DIAGNOSIS — R55 Syncope and collapse: Secondary | ICD-10-CM

## 2022-08-11 LAB — CUP PACEART REMOTE DEVICE CHECK
Battery Remaining Longevity: 3 mo — CL
Battery Remaining Percentage: 3 %
Brady Statistic RA Percent Paced: 6 %
Brady Statistic RV Percent Paced: 0 %
Date Time Interrogation Session: 20231207054100
Implantable Lead Connection Status: 753985
Implantable Lead Connection Status: 753985
Implantable Lead Implant Date: 20150818
Implantable Lead Implant Date: 20150818
Implantable Lead Location: 753859
Implantable Lead Location: 753860
Implantable Lead Model: 4135
Implantable Lead Model: 4136
Implantable Lead Serial Number: 29563765
Implantable Lead Serial Number: 29591923
Implantable Pulse Generator Implant Date: 20150818
Lead Channel Impedance Value: 487 Ohm
Lead Channel Impedance Value: 567 Ohm
Lead Channel Pacing Threshold Amplitude: 0.4 V
Lead Channel Pacing Threshold Amplitude: 0.8 V
Lead Channel Pacing Threshold Pulse Width: 0.4 ms
Lead Channel Pacing Threshold Pulse Width: 0.4 ms
Lead Channel Setting Pacing Amplitude: 1.5 V
Lead Channel Setting Pacing Amplitude: 2 V
Lead Channel Setting Pacing Pulse Width: 0.4 ms
Lead Channel Setting Sensing Sensitivity: 2.5 mV
Pulse Gen Serial Number: 702583
Zone Setting Status: 755011

## 2022-08-12 ENCOUNTER — Telehealth: Payer: Self-pay | Admitting: Internal Medicine

## 2022-08-12 NOTE — Telephone Encounter (Signed)
Returned patients call. Advised patient her remote transmission looks fine. Her battery is shows <3 months. Patient is currently on monthly battery checks so nothing to do at this point. Patient appreciative of call.

## 2022-08-12 NOTE — Telephone Encounter (Signed)
Patient called stating she saw a note in MyChart about an abnormal transmission last month.  She stated she knows she needs battery change in her pace maker.  She doesn't have a follow up visit until February.  She would like to speak to someone about last month's transmission.

## 2022-08-24 DIAGNOSIS — H40053 Ocular hypertension, bilateral: Secondary | ICD-10-CM | POA: Diagnosis not present

## 2022-09-02 NOTE — Progress Notes (Signed)
Remote pacemaker transmission.   

## 2022-10-18 ENCOUNTER — Encounter: Payer: Self-pay | Admitting: Internal Medicine

## 2022-10-18 ENCOUNTER — Ambulatory Visit: Payer: Medicare Other | Attending: Internal Medicine | Admitting: Internal Medicine

## 2022-10-18 VITALS — BP 134/78 | HR 67 | Ht 68.0 in | Wt 164.4 lb

## 2022-10-18 DIAGNOSIS — I4891 Unspecified atrial fibrillation: Secondary | ICD-10-CM | POA: Diagnosis not present

## 2022-10-18 DIAGNOSIS — Z950 Presence of cardiac pacemaker: Secondary | ICD-10-CM | POA: Insufficient documentation

## 2022-10-18 DIAGNOSIS — R55 Syncope and collapse: Secondary | ICD-10-CM | POA: Insufficient documentation

## 2022-10-18 NOTE — Progress Notes (Signed)
Patient Care Team: Wenda Low, MD as PCP - General (Internal Medicine)   HPI  Bianca Contreras is a 82 y.o. female Seen in follow-up for pacemaker implanted 2015 elsewhere-[employer].  Approaching ERI.  Also atrial fibrillation by outside report.  On Eliquis no  bleeding  Remote syncope but has had none since.  My reading of the notes suggested that it may well have been neurally mediated.  Cardiac evaluation 2015 included a calcium score that was abnormal catheterization that was nonobstructive.  Echocardiogram normal  The patient denies chest pain,  nocturnal dyspnea, orthopnea or peripheral edema.  There have been no palpitations, lightheadedness or syncope.   Modest dyspnea on exertion which is stable  It will episode of difficulty finding words that lasted about 2 minutes thought by her PCP to possibly be TIA      Date Cr K Hgb  3/23 0.85 4.6 12.2         Thromboembolic risk factors ( age  -2, HTN-1, TIA/CVA-2, Gender-1) for a CHADSVASc Score of >=6    Past Medical History:  Diagnosis Date   A-fib ([place])    Allergic rhinitis    DDD (degenerative disc disease), lumbar    Exertional dyspnea    GERD (gastroesophageal reflux disease)    High cholesterol    Hypertension    Osteoarthritis    Osteoporosis    Pacemaker    Prediabetes    Schatzki's ring     Past Surgical History:  Procedure Laterality Date   BREAST BIOPSY     25 years ago cyst biopsy   INSERT / REPLACE / REMOVE PACEMAKER      Current Outpatient Medications  Medication Sig Dispense Refill   alendronate (FOSAMAX) 70 MG tablet Take 70 mg by mouth once a week.     amLODipine (NORVASC) 2.5 MG tablet Take 1 tablet by mouth daily.     Ascorbic Acid (VITAMIN C) 1000 MG tablet Take 1,000 mg by mouth daily.     Calcium Carb-Cholecalciferol (CALCIUM-VITAMIN D) 500-200 MG-UNIT tablet Take 1 tablet by mouth daily.     cetirizine (ZYRTEC) 10 MG tablet Take 10 mg by mouth daily.      Cholecalciferol (VITAMIN D) 50 MCG (2000 UT) CAPS Take 1 capsule by mouth daily at 6 (six) AM.     ELIQUIS 5 MG TABS tablet Take 1 tablet by mouth 2 (two) times daily.     fluticasone (FLONASE) 50 MCG/ACT nasal spray Place 2 sprays into both nostrils daily as needed for allergies or rhinitis.     GINKGO BILOBA PO Take 120 mg by mouth daily at 6 (six) AM.     metoprolol tartrate (LOPRESSOR) 50 MG tablet Take 1 tablet by mouth 2 (two) times daily.     mometasone (ELOCON) 0.1 % cream Apply 1 application topically daily as needed (for dry skin).     omeprazole (PRILOSEC) 20 MG capsule Take 1 capsule by mouth daily.     rosuvastatin (CRESTOR) 5 MG tablet Take 1 tablet by mouth daily.     Triprolidine-Pseudoephedrine (ANTIHISTAMINE PO) Take 25 mg by mouth daily.     vitamin B-12 (CYANOCOBALAMIN) 1000 MCG tablet Take 6,000 mcg by mouth daily.      No current facility-administered medications for this visit.    No Known Allergies    Review of Systems negative except from HPI and PMH  Physical Exam BP 134/78   Pulse 67   Ht 5' 8"$  (1.727 m)  Wt 164 lb 6.4 oz (74.6 kg)   SpO2 98%   BMI 25.00 kg/m  Well developed and well nourished in no acute distress HENT normal Neck supple with JVP-flat Clear Device pocket well healed; without hematoma or erythema.  There is no tethering  Regular rate and rhythm, no  murmur Abd-soft with active BS No Clubbing cyanosis  edema Skin-warm and dry A & Oriented  Grossly normal sensory and motor function  ECG    Device function is normal. Programming changes   See Paceart for details     Assessment and  Plan  Syncope-probably neurally mediated   Dyspnea on exertion   Pacemaker-[employer]  Atrial fibrillation by outside report  No syncope  Euvolemic.  Her device is approaching ERI, we discussed the procedure of device generator replacement.

## 2022-10-18 NOTE — Patient Instructions (Signed)
Medication Instructions:  Your physician recommends that you continue on your current medications as directed. Please refer to the Current Medication list given to you today.  *If you need a refill on your cardiac medications before your next appointment, please call your pharmacy*   Lab Work: None ordered.  If you have labs (blood work) drawn today and your tests are completely normal, you will receive your results only by: [place] (if you have MyChart) OR A paper copy in the mail If you have any lab test that is abnormal or we need to change your treatment, we will call you to review the results.   Testing/Procedures: None ordered.    Follow-Up: At [facility], you and your health needs are our priority.  As part of our continuing mission to provide you with exceptional heart care, we have created designated Provider Care Teams.  These Care Teams include your primary Cardiologist (physician) and Advanced Practice Providers (APPs -  Physician Assistants and Nurse Practitioners) who all work together to provide you with the care you need, when you need it.  We recommend signing up for the patient portal called "MyChart".  Sign up information is provided on this After Visit Summary.  MyChart is used to connect with patients for Virtual Visits (Telemedicine).  Patients are able to view lab/test results, encounter notes, upcoming appointments, etc.  Non-urgent messages can be sent to your provider as well.   To learn more about what you can do with MyChart, go to [email or web address].    Your next appointment:   To be scheduled  Other Instructions  [facility]  [phone] - we can provide you with the prescription but cannot fax it to the [facility]

## 2022-10-23 LAB — CUP PACEART REMOTE DEVICE CHECK
Brady Statistic RA Percent Paced: 5 %
Brady Statistic RV Percent Paced: 0 %
Date Time Interrogation Session: 20240218060300
Implantable Lead Connection Status: 753985
Implantable Lead Connection Status: 753985
Implantable Lead Implant Date: 20150818
Implantable Lead Implant Date: 20150818
Implantable Lead Location: 753859
Implantable Lead Location: 753860
Implantable Lead Model: 4135
Implantable Lead Model: 4136
Implantable Lead Serial Number: 29563765
Implantable Lead Serial Number: 29591923
Implantable Pulse Generator Implant Date: 20150818
Lead Channel Impedance Value: 487 Ohm
Lead Channel Impedance Value: 546 Ohm
Lead Channel Pacing Threshold Amplitude: 0.4 V
Lead Channel Pacing Threshold Amplitude: 0.9 V
Lead Channel Pacing Threshold Pulse Width: 0.4 ms
Lead Channel Pacing Threshold Pulse Width: 0.4 ms
Lead Channel Setting Pacing Amplitude: 1.4 V
Lead Channel Setting Pacing Amplitude: 2 V
Lead Channel Setting Pacing Pulse Width: 0.4 ms
Lead Channel Setting Sensing Sensitivity: 2.5 mV
Pulse Gen Serial Number: 702583
Zone Setting Status: 755011

## 2022-10-24 ENCOUNTER — Ambulatory Visit: Payer: Medicare Other

## 2022-10-24 ENCOUNTER — Telehealth: Payer: Self-pay

## 2022-10-24 DIAGNOSIS — R001 Bradycardia, unspecified: Secondary | ICD-10-CM

## 2022-10-24 DIAGNOSIS — Z950 Presence of cardiac pacemaker: Secondary | ICD-10-CM

## 2022-10-24 DIAGNOSIS — I480 Paroxysmal atrial fibrillation: Secondary | ICD-10-CM

## 2022-10-24 DIAGNOSIS — R55 Syncope and collapse: Secondary | ICD-10-CM

## 2022-10-24 DIAGNOSIS — Z01812 Encounter for preprocedural laboratory examination: Secondary | ICD-10-CM

## 2022-10-24 NOTE — Telephone Encounter (Signed)
Scheduled transmission and alert remote reviewed. Normal device function.   The device has reached the elective replacement indicator on 10/22/2022.  Sent to triage. Next remote 11/24/2022.  Kathy Breach, RN, CCDS, [employer]   Spoke with patient. She is aware.  Recently saw Dr. Caryl Comes and discussed gen change.  Forwarding to Dr. Olin Pia nurse for next steps.  Patient aware to be expecting a follow up call.

## 2022-10-25 NOTE — Telephone Encounter (Signed)
Spoke with pt and reviewed instructions for PPM generator change scheduled for 11/21/2022.  See letter for complete details.  Letter placed at front desk with surgical scrub for pt pick up.  Pt verbalizes understanding and thanked Therapist, sports for the call.

## 2022-11-14 ENCOUNTER — Ambulatory Visit: Payer: Medicare Other | Attending: Internal Medicine

## 2022-11-14 DIAGNOSIS — I480 Paroxysmal atrial fibrillation: Secondary | ICD-10-CM | POA: Diagnosis not present

## 2022-11-14 DIAGNOSIS — R001 Bradycardia, unspecified: Secondary | ICD-10-CM

## 2022-11-14 DIAGNOSIS — Z01812 Encounter for preprocedural laboratory examination: Secondary | ICD-10-CM

## 2022-11-14 DIAGNOSIS — Z950 Presence of cardiac pacemaker: Secondary | ICD-10-CM

## 2022-11-14 DIAGNOSIS — R55 Syncope and collapse: Secondary | ICD-10-CM

## 2022-11-14 LAB — CBC
Hematocrit: 35.7 % (ref 34.0–46.6)
Hemoglobin: 11.9 g/dL (ref 11.1–15.9)
MCH: 29.2 pg (ref 26.6–33.0)
MCHC: 33.3 g/dL (ref 31.5–35.7)
MCV: 88 fL (ref 79–97)
Platelets: 295 10*3/uL (ref 150–450)
RBC: 4.08 x10E6/uL (ref 3.77–5.28)
RDW: 15 % (ref 11.7–15.4)
WBC: 5.4 10*3/uL (ref 3.4–10.8)

## 2022-11-14 LAB — BASIC METABOLIC PANEL
BUN/Creatinine Ratio: 13 (ref 12–28)
BUN: 11 mg/dL (ref 8–27)
CO2: 26 mmol/L (ref 20–29)
Calcium: 9.2 mg/dL (ref 8.7–10.3)
Chloride: 105 mmol/L (ref 96–106)
Creatinine, Ser: 0.86 mg/dL (ref 0.57–1.00)
Glucose: 91 mg/dL (ref 70–99)
Potassium: 4.4 mmol/L (ref 3.5–5.2)
Sodium: 139 mmol/L (ref 134–144)
eGFR: 67 mL/min/{1.73_m2} (ref >59)

## 2022-11-18 NOTE — Pre-Procedure Instructions (Signed)
Instructed patient on the following items: Arrival time 0800 Nothing to eat or drink after midnight No meds AM of procedure Responsible person to drive you home and stay with you for 24 hrs Wash with special soap night before and morning of procedure If on anti-coagulant drug instructions Eliquis- last dose Sunday 3/17 AM

## 2022-11-21 ENCOUNTER — Ambulatory Visit (HOSPITAL_COMMUNITY)
Admission: RE | Admit: 2022-11-21 | Discharge: 2022-11-21 | Disposition: A | Discharge status: Home / Self Care | Payer: Medicare Other | Attending: Internal Medicine | Admitting: Internal Medicine

## 2022-11-21 ENCOUNTER — Encounter (HOSPITAL_COMMUNITY)
Admission: RE | Disposition: A | Discharge status: Home / Self Care | Payer: Self-pay | Source: Home / Self Care | Attending: Internal Medicine

## 2022-11-21 ENCOUNTER — Other Ambulatory Visit: Payer: Self-pay

## 2022-11-21 DIAGNOSIS — R55 Syncope and collapse: Secondary | ICD-10-CM | POA: Insufficient documentation

## 2022-11-21 DIAGNOSIS — Z45010 Encounter for checking and testing of cardiac pacemaker pulse generator [battery]: Secondary | ICD-10-CM | POA: Diagnosis not present

## 2022-11-21 DIAGNOSIS — I4891 Unspecified atrial fibrillation: Secondary | ICD-10-CM | POA: Diagnosis not present

## 2022-11-21 DIAGNOSIS — R0609 Other forms of dyspnea: Secondary | ICD-10-CM | POA: Diagnosis not present

## 2022-11-21 HISTORY — PX: PPM GENERATOR CHANGEOUT: EP1233

## 2022-11-21 SURGERY — PPM GENERATOR CHANGEOUT

## 2022-11-21 MED ORDER — CEFAZOLIN SODIUM-DEXTROSE 2-4 GM/100ML-% IV SOLN
2.0000 g | INTRAVENOUS | Status: AC
  Administered 2022-11-21: 2 g via INTRAVENOUS

## 2022-11-21 MED ORDER — SODIUM CHLORIDE 0.9 % IV SOLN: INTRAVENOUS | Status: DC

## 2022-11-21 MED ORDER — FENTANYL CITRATE (PF) 100 MCG/2ML IJ SOLN
INTRAMUSCULAR | Status: DC | PRN
  Administered 2022-11-21: 25 ug via INTRAVENOUS

## 2022-11-21 MED ORDER — ACETAMINOPHEN 325 MG PO TABS: 325.0000 mg | ORAL_TABLET | ORAL | Status: DC | PRN

## 2022-11-21 MED ORDER — LIDOCAINE HCL (PF) 1 % IJ SOLN
INTRAMUSCULAR | Status: AC
  Filled 2022-11-21: qty 60

## 2022-11-21 MED ORDER — LIDOCAINE HCL (PF) 1 % IJ SOLN
INTRAMUSCULAR | Status: DC | PRN
  Administered 2022-11-21: 60 mL

## 2022-11-21 MED ORDER — MIDAZOLAM HCL 5 MG/5ML IJ SOLN
INTRAMUSCULAR | Status: DC | PRN
  Administered 2022-11-21: 1 mg via INTRAVENOUS

## 2022-11-21 MED ORDER — SODIUM CHLORIDE 0.9 % IV SOLN: INTRAVENOUS | Status: AC

## 2022-11-21 MED ORDER — CHLORHEXIDINE GLUCONATE 4 % EX LIQD: 4.0000 | Freq: Once | CUTANEOUS | Status: DC

## 2022-11-21 MED ORDER — POVIDONE-IODINE 10 % EX SWAB
2.0000 | Freq: Once | CUTANEOUS | Status: AC
  Administered 2022-11-21: 2 via TOPICAL

## 2022-11-21 MED ORDER — SODIUM CHLORIDE 0.9 % IV SOLN
INTRAVENOUS | Status: AC
  Filled 2022-11-21: qty 2

## 2022-11-21 MED ORDER — FENTANYL CITRATE (PF) 100 MCG/2ML IJ SOLN
INTRAMUSCULAR | Status: AC
  Filled 2022-11-21: qty 2

## 2022-11-21 MED ORDER — SODIUM CHLORIDE 0.9 % IV SOLN
80.0000 mg | INTRAVENOUS | Status: AC
  Administered 2022-11-21: 80 mg

## 2022-11-21 MED ORDER — MIDAZOLAM HCL 5 MG/5ML IJ SOLN
INTRAMUSCULAR | Status: AC
  Filled 2022-11-21: qty 5

## 2022-11-21 MED ORDER — CEFAZOLIN SODIUM-DEXTROSE 2-4 GM/100ML-% IV SOLN
INTRAVENOUS | Status: AC
  Filled 2022-11-21: qty 100

## 2022-11-21 SURGICAL SUPPLY — 7 items
CABLE SURGICAL S-101-97-12 (CABLE) ×1 IMPLANT
DEVICE DISSECT PLASMABLAD 3.0S (MISCELLANEOUS) IMPLANT
HEMOSTAT SURGICEL 2X4 FIBR (HEMOSTASIS) IMPLANT
PACEMAKER ACCOLADE DR-EL (Pacemaker) IMPLANT
PAD DEFIB RADIO PHYSIO CONN (PAD) ×1 IMPLANT
PLASMABLADE 3.0S (MISCELLANEOUS) ×1
TRAY PACEMAKER INSERTION (PACKS) ×1 IMPLANT

## 2022-11-21 NOTE — H&P (Signed)
Patient Care Team: Wenda Low, MD as PCP - General (Internal Medicine)   HPI  Bianca Contreras is a 82 y.o. female admitted for pacemaker generator replacement [employer] 2015 now at ERI  Hx of AFib and remote syncope suggestive of neurally mediated  Cardiac evaluation 2015 included a calcium score that was abnormal catheterization that was nonobstructive. Echocardiogram normal   The patient denies chest pain, shortness of breath, nocturnal dyspnea, orthopnea or peripheral edema.  There have been no palpitations, lightheadedness or syncope.        Date Cr K Hgb  3/23 0.85 4.6 12.2   3/24 0.86 4.4 [id number]    Thromboembolic risk factors ( age  -2, HTN-1, TIA/CVA-2, Gender-1) for a CHADSVASc Score of >=6     Records and Results Reviewed   Past Medical History:  Diagnosis Date   A-fib ([place])    Allergic rhinitis    DDD (degenerative disc disease), lumbar    Exertional dyspnea    GERD (gastroesophageal reflux disease)    High cholesterol    Hypertension    Osteoarthritis    Osteoporosis    Pacemaker    Prediabetes    Schatzki's ring     Past Surgical History:  Procedure Laterality Date   BREAST BIOPSY     25 years ago cyst biopsy   INSERT / REPLACE / REMOVE PACEMAKER      Current Facility-Administered Medications  Medication Dose Route Frequency Provider Last Rate Last Admin   0.9 %  sodium chloride infusion   Intravenous Continuous Deboraha Sprang, MD 50 mL/hr at 11/21/22 0844 New Bag at 11/21/22 0844   ceFAZolin (ANCEF) IVPB 2g/100 mL premix  2 g Intravenous On Call Deboraha Sprang, MD       chlorhexidine (HIBICLENS) 4 % liquid 4 Application  4 Application Topical Once Deboraha Sprang, MD       gentamicin (GARAMYCIN) 80 mg in sodium chloride 0.9 % 500 mL irrigation  80 mg Irrigation On Call Deboraha Sprang, MD        No Known Allergies    Social History   Tobacco Use   Smoking status: Former   Smokeless tobacco: Never  Vaping Use    Vaping Use: Never used  Substance Use Topics   Alcohol use: Yes    Comment: occasionally/socially 1 every 18mon   Drug use: No     Family History  Problem Relation Age of Onset   Heart attack Mother    Tuberculosis Mother    Cancer Father    Heart failure Maternal Grandmother    Breast cancer Cousin        82s     Current Meds  Medication Sig   alendronate (FOSAMAX) 70 MG tablet Take 70 mg by mouth every Monday.   amLODipine (NORVASC) 2.5 MG tablet Take 1 tablet by mouth daily.   Ascorbic Acid (VITAMIN C) 1000 MG tablet Take 1,000 mg by mouth daily.   Calcium Carb-Cholecalciferol (CALCIUM 600 + D PO) Take 1 tablet by mouth daily.   Cholecalciferol (VITAMIN D) 50 MCG (2000 UT) CAPS Take 1 capsule by mouth daily at 6 (six) AM.   diphenhydrAMINE (BENADRYL) 25 MG tablet Take 25 mg by mouth 2 (two) times daily.   fluticasone (FLONASE) 50 MCG/ACT nasal spray Place 1 spray into both nostrils See admin instructions. Use 1 spray in each nostril daily, may use a second time as needed for allergies   Ginkgo Biloba  120 MG CAPS Take 120 mg by mouth daily.   latanoprost (XALATAN) 0.005 % ophthalmic solution Place 1 drop into both eyes at bedtime.   metoprolol tartrate (LOPRESSOR) 50 MG tablet Take 1 tablet by mouth 2 (two) times daily.   Naphazoline HCl (CLEAR EYES OP) Place 1 drop into both eyes daily.   omeprazole (PRILOSEC) 20 MG capsule Take 20 mg by mouth daily.   rosuvastatin (CRESTOR) 5 MG tablet Take 1 tablet by mouth daily.   vitamin B-12 (CYANOCOBALAMIN) 1000 MCG tablet Take 6,000 mcg by mouth daily.      Review of Systems negative except from HPI and PMH  Physical Exam BP (!) 154/73   Pulse 67   Temp 98.5 F (36.9 C) (Oral)   Resp 18   Ht 5\' 8"  (1.727 m)   Wt 73 kg   SpO2 97%   BMI 24.48 kg/m  Well developed and well nourished in no acute distress HENT normal E scleral and icterus clear Neck Supple Clear to ausculation Device pocket well healed; without hematoma or  erythema.  There is no tethering Regular rate and rhythm, no murmurs gallops or rub Soft with active bowel sounds No clubbing cyanosis  Edema Alert and oriented, grossly normal motor and sensory function Skin Warm and Dry    Assessment and  Plan  Syncope-probably neurally mediated   Dyspnea on exertion   Pacemaker-[employer]   Atrial fibrillation by outside report  Device at ERI      We have reviewed the benefits and risks of generator replacement.  These include but are not limited to lead fracture and infection.  The patient understands, agrees and is willing to proceed.

## 2022-11-21 NOTE — Discharge Instructions (Signed)

## 2022-11-22 ENCOUNTER — Encounter (HOSPITAL_COMMUNITY): Payer: Self-pay | Admitting: Internal Medicine

## 2022-11-24 ENCOUNTER — Ambulatory Visit: Payer: Medicare Other

## 2022-12-01 ENCOUNTER — Ambulatory Visit: Payer: Medicare Other | Attending: Internal Medicine

## 2022-12-01 DIAGNOSIS — R55 Syncope and collapse: Secondary | ICD-10-CM | POA: Insufficient documentation

## 2022-12-01 DIAGNOSIS — Z950 Presence of cardiac pacemaker: Secondary | ICD-10-CM | POA: Diagnosis not present

## 2022-12-01 LAB — CUP PACEART INCLINIC DEVICE CHECK
Date Time Interrogation Session: 20240328165241
Implantable Lead Connection Status: 753985
Implantable Lead Connection Status: 753985
Implantable Lead Implant Date: 20150818
Implantable Lead Implant Date: 20150818
Implantable Lead Location: 753859
Implantable Lead Location: 753860
Implantable Lead Model: 4135
Implantable Lead Model: 4136
Implantable Lead Serial Number: 29563765
Implantable Lead Serial Number: 29591923
Implantable Pulse Generator Implant Date: 20240318
Lead Channel Impedance Value: 489 Ohm
Lead Channel Impedance Value: 536 Ohm
Lead Channel Pacing Threshold Amplitude: 0.5 V
Lead Channel Pacing Threshold Amplitude: 0.8 V
Lead Channel Pacing Threshold Pulse Width: 0.4 ms
Lead Channel Pacing Threshold Pulse Width: 0.4 ms
Lead Channel Sensing Intrinsic Amplitude: 13.3 mV
Lead Channel Sensing Intrinsic Amplitude: 4.5 mV
Lead Channel Setting Pacing Amplitude: 2 V
Lead Channel Setting Pacing Amplitude: 2 V
Lead Channel Setting Pacing Pulse Width: 0.4 ms
Lead Channel Setting Sensing Sensitivity: 2.5 mV
Pulse Gen Serial Number: 602911
Zone Setting Status: 755011

## 2022-12-01 NOTE — Patient Instructions (Addendum)
After Your Pacemaker  Do NOT take your Eliquis until next Monday 12/05/2022   Monitor your pacemaker site for redness, swelling, and drainage. Call the device clinic at [phone] if you experience these symptoms or fever/chills.  Your incision was closed with Dermabond:  You may shower 1 day after your defibrillator implant and wash your incision with soap and water. Avoid lotions, ointments, or perfumes over your incision until it is well-healed.  You may use a hot tub or a pool after your wound check appointment if the incision is completely closed.  There are no restrictions in arm movement after your wound check appointment.  You may drive, unless driving has been restricted by your healthcare providers.  Remote monitoring is used to monitor your pacemaker from home. This monitoring is scheduled every 91 days by our office. It allows [place] to keep an eye on the functioning of your device to ensure it is working properly. You will routinely see your Electrophysiologist annually (more often if necessary).

## 2022-12-01 NOTE — Progress Notes (Signed)
Wound check appointment. Steri-strips removed. Wound without redness. Incision edges approximated.  Pt with swelling at device site.  Bruising noted.  Site evaluated by Dr. Lovena Le.  Recommend holding Eliquis until Monday.  Pt aware to call if site does not continue to improve.  Normal device function. Thresholds, sensing, and impedances consistent with implant measurements. Device programmed at chronic outputs for gen change. Histogram distribution appropriate for patient and level of activity. No mode switches or high ventricular rates noted. Patient educated about wound care. ROV in 3 months with implanting physician.

## 2022-12-05 NOTE — Progress Notes (Signed)
Remote pacemaker transmission.   

## 2022-12-07 ENCOUNTER — Ambulatory Visit: Payer: Medicare Other | Attending: Cardiology

## 2022-12-07 ENCOUNTER — Telehealth: Payer: Self-pay | Admitting: Cardiology

## 2022-12-07 DIAGNOSIS — Z950 Presence of cardiac pacemaker: Secondary | ICD-10-CM

## 2022-12-07 MED ORDER — CEPHALEXIN 500 MG PO CAPS: 500.0000 mg | ORAL_CAPSULE | Freq: Two times a day (BID) | ORAL | 0 refills | Status: DC

## 2022-12-07 NOTE — Patient Instructions (Addendum)
You do have swelling and oozing of old blood from hematoma at your wound site which we have controlled.  Hold your Eliquis until follow up with Dr. Caryl Comes next week in device clinic on 12/13/22 at 2:00pm.  Start Keflex (cephalexin) antibiotic today taking 500mg  twice daily with food.   Monitor for signs of infection and/or increased swelling or signs of uncontrollable bleeding at incision site.  Keep area covered with bandage while still has small amount of oozing and wash area gently daily with Dial soap and water using clean cloth/towel each time.  Call the device clinic at [phone] for any concerns or go to the ER if after hours.

## 2022-12-07 NOTE — Telephone Encounter (Signed)
Patient scheduled at 0900 for wound check today

## 2022-12-07 NOTE — Progress Notes (Signed)
Patient seen in clinic today after calling to report bleeding from her device site.  She had a pacemaker generator change out on 11/21/22 and was in last week on 3/28 for her postoperative wound/device check. Device function was normal. Hematoma noted in pocket but appeared healing and was told to restart Eliquis on 12/05/22 which she did. This morning when waking up she noted soaking of her night gown with blood over device site area.   Patient presents with large hematoma in pocket and oozing of dark red blood from small opening at incision line. Per patient, hematoma has not increased in size from last week.  The hematoma at the site is noticeably much smaller following expression of large amount of old, dark brown blood from the incision during office visit today.  Reviewed wound site with Dr. Curt Bears who orders for patient to start Keflex 500mg  bid X 5 days and hold Eliquis until follow up next week with Dr. Caryl Comes.    Medication and wound care instructions given to patient.  She is to keep area covered with loose bandage.  Okay to wash/shower daily using clean cloth each time and re-applying dressing after cleansing.  Monitor for s/s of infection and or new active bleeding.  Patient given device clinic number and ER precautions if concerns develop.  Appointment made for 12/13/22 at 2pm when Dr. Caryl Comes is in the office for follow up and further direction.

## 2022-12-07 NOTE — Telephone Encounter (Signed)
Patient called in reporting she woke up this morning and noted some blood on her gown at her PPM site, recent gen change. Just started her Eliquis back on Monday after being seen in the wound clinic 3/28. Had swelling at site then and Eliquis was delayed until 4/1. She reports she was able to place a bandage over the site with 3 bandaids. Still with slight ooz but controlled. Does not appear that the site is open. No significant pain. I advised to leave the dressing in place and would route message to the office in attempts to bring in for visit today. If site were to change or bleeding increase then proceed to the ED. Advised to hold Eliquis at this time with active bleeding. She voiced understanding and thanked me for the callback.

## 2022-12-07 NOTE — Telephone Encounter (Signed)
Patient called again regarding bleeding at surgical site.

## 2022-12-13 ENCOUNTER — Ambulatory Visit: Payer: Medicare Other | Attending: Cardiology

## 2022-12-13 DIAGNOSIS — M810 Age-related osteoporosis without current pathological fracture: Secondary | ICD-10-CM | POA: Diagnosis not present

## 2022-12-13 DIAGNOSIS — I4891 Unspecified atrial fibrillation: Secondary | ICD-10-CM | POA: Diagnosis not present

## 2022-12-13 DIAGNOSIS — E7800 Pure hypercholesterolemia, unspecified: Secondary | ICD-10-CM | POA: Diagnosis not present

## 2022-12-13 DIAGNOSIS — K219 Gastro-esophageal reflux disease without esophagitis: Secondary | ICD-10-CM | POA: Diagnosis not present

## 2022-12-13 DIAGNOSIS — K222 Esophageal obstruction: Secondary | ICD-10-CM | POA: Diagnosis not present

## 2022-12-13 DIAGNOSIS — I10 Essential (primary) hypertension: Secondary | ICD-10-CM | POA: Diagnosis not present

## 2022-12-13 DIAGNOSIS — Z0000 Encounter for general adult medical examination without abnormal findings: Secondary | ICD-10-CM | POA: Diagnosis not present

## 2022-12-13 DIAGNOSIS — R001 Bradycardia, unspecified: Secondary | ICD-10-CM

## 2022-12-13 DIAGNOSIS — D6869 Other thrombophilia: Secondary | ICD-10-CM | POA: Diagnosis not present

## 2022-12-13 DIAGNOSIS — M5136 Other intervertebral disc degeneration, lumbar region: Secondary | ICD-10-CM | POA: Diagnosis not present

## 2022-12-13 DIAGNOSIS — Z1389 Encounter for screening for other disorder: Secondary | ICD-10-CM | POA: Diagnosis not present

## 2022-12-13 DIAGNOSIS — R7303 Prediabetes: Secondary | ICD-10-CM | POA: Diagnosis not present

## 2022-12-13 DIAGNOSIS — Z950 Presence of cardiac pacemaker: Secondary | ICD-10-CM | POA: Diagnosis not present

## 2022-12-13 NOTE — Patient Instructions (Addendum)
Medication Instructions:  Your physician has recommended you make the following change in your medication:    CONTINUE to HOLD your Eliquis until your next appointment  Labwork: None ordered.  Testing/Procedures: None ordered.  Follow-Up: Your physician wants you to follow-up in:   December 20, 2022 at 2:00 PM for a wound recheck

## 2022-12-13 NOTE — Progress Notes (Signed)
Pt seen in device clinic for follow up on hematoma.  Site assessed with Dr. Graciela Husbands.  Wound without redness.  Incision edges approximated.  Hematoma continues but no active bleeding noted.  Hematoma is soft to palpitation.  Pt will continue to hold Eliquis until wound recheck next week December 20, 2022.  Advised to call if any fevers, chills or change to wound site.  Pt indicates understanding.

## 2022-12-14 DIAGNOSIS — H40053 Ocular hypertension, bilateral: Secondary | ICD-10-CM | POA: Diagnosis not present

## 2022-12-20 ENCOUNTER — Ambulatory Visit: Payer: Medicare Other | Attending: Interventional Cardiology

## 2022-12-20 DIAGNOSIS — Z950 Presence of cardiac pacemaker: Secondary | ICD-10-CM

## 2022-12-20 NOTE — Patient Instructions (Addendum)
Continue Holding Eliquis until after  appointment on 12/27/2022.

## 2022-12-20 NOTE — Progress Notes (Signed)
Wound Re-Check, per SK patient to continue holding Eliquis until after 12/27/22

## 2022-12-26 ENCOUNTER — Ambulatory Visit: Payer: Medicare Other

## 2022-12-26 LAB — CUP PACEART REMOTE DEVICE CHECK
Battery Remaining Longevity: 180 mo
Battery Remaining Percentage: 100 %
Brady Statistic RA Percent Paced: 3 %
Brady Statistic RV Percent Paced: 0 %
Date Time Interrogation Session: 20240422054100
Implantable Lead Connection Status: 753985
Implantable Lead Connection Status: 753985
Implantable Lead Implant Date: 20150818
Implantable Lead Implant Date: 20150818
Implantable Lead Location: 753859
Implantable Lead Location: 753860
Implantable Lead Model: 4135
Implantable Lead Model: 4136
Implantable Lead Serial Number: 29563765
Implantable Lead Serial Number: 29591923
Implantable Pulse Generator Implant Date: 20240318
Lead Channel Impedance Value: 476 Ohm
Lead Channel Impedance Value: 526 Ohm
Lead Channel Pacing Threshold Amplitude: 0.4 V
Lead Channel Pacing Threshold Pulse Width: 0.4 ms
Lead Channel Setting Pacing Amplitude: 2 V
Lead Channel Setting Pacing Amplitude: 2 V
Lead Channel Setting Pacing Pulse Width: 0.4 ms
Lead Channel Setting Sensing Sensitivity: 2.5 mV
Pulse Gen Serial Number: 602911
Zone Setting Status: 755011

## 2022-12-27 ENCOUNTER — Ambulatory Visit: Payer: Medicare Other | Attending: Interventional Cardiology

## 2022-12-27 DIAGNOSIS — Z950 Presence of cardiac pacemaker: Secondary | ICD-10-CM

## 2022-12-27 NOTE — Progress Notes (Signed)
Patient seen today for ongoing follow up for hematoma at device site.  Area is healing WNL, no signs of infection or regression of site, reviewed with Dr. Graciela Husbands today. Okay to restart Eliquis and have patient continue monitoring for new bleeding and/or infection at home as wound site is almost completely healed.  Monitoring instructions and device clinic number given if any concerns develop.

## 2022-12-27 NOTE — Patient Instructions (Addendum)
Your wound site is healing very well.  Continue to monitor for any new signs of swelling at device incision site.  If any signs of redness, swelling or drainage call our device office directly at [phone].   Restart your Eliquis today.  No further follow up is needed unless any concerns develop until your 91 day appointment with Dr. Graciela Husbands in June.   Thank you.

## 2023-01-26 ENCOUNTER — Ambulatory Visit: Payer: Medicare Other

## 2023-02-27 ENCOUNTER — Ambulatory Visit: Payer: Medicare Other

## 2023-02-28 ENCOUNTER — Other Ambulatory Visit: Payer: Self-pay

## 2023-02-28 ENCOUNTER — Ambulatory Visit: Payer: Medicare Other | Attending: Internal Medicine | Admitting: Internal Medicine

## 2023-02-28 ENCOUNTER — Encounter: Payer: Self-pay | Admitting: Internal Medicine

## 2023-02-28 VITALS — BP 128/74 | HR 70 | Ht 68.0 in | Wt 165.6 lb

## 2023-02-28 DIAGNOSIS — Z950 Presence of cardiac pacemaker: Secondary | ICD-10-CM | POA: Diagnosis not present

## 2023-02-28 DIAGNOSIS — R55 Syncope and collapse: Secondary | ICD-10-CM | POA: Insufficient documentation

## 2023-02-28 DIAGNOSIS — I4891 Unspecified atrial fibrillation: Secondary | ICD-10-CM | POA: Diagnosis not present

## 2023-02-28 NOTE — Patient Instructions (Signed)
Medication Instructions:  Your physician recommends that you continue on your current medications as directed. Please refer to the Current Medication list given to you today.  *If you need a refill on your cardiac medications before your next appointment, please call your pharmacy*   Lab Work: None ordered.  If you have labs (blood work) drawn today and your tests are completely normal, you will receive your results only by: MyChart Message (if you have MyChart) OR A paper copy in the mail If you have any lab test that is abnormal or we need to change your treatment, we will call you to review the results.   Testing/Procedures: None ordered.    Follow-Up: At [facility], you and your health needs are our priority.  As part of our continuing mission to provide you with exceptional heart care, we have created designated Provider Care Teams.  These Care Teams include your primary Cardiologist (physician) and Advanced Practice Providers (APPs -  Physician Assistants and Nurse Practitioners) who all work together to provide you with the care you need, when you need it.  We recommend signing up for the patient portal called "MyChart".  Sign up information is provided on this After Visit Summary.  MyChart is used to connect with patients for Virtual Visits (Telemedicine).  Patients are able to view lab/test results, encounter notes, upcoming appointments, etc.  Non-urgent messages can be sent to your provider as well.   To learn more about what you can do with MyChart, go to [email or web address].    Your next appointment:   9 months with Dr Klein 

## 2023-02-28 NOTE — Progress Notes (Signed)
Patient Care Team: Georgann Housekeeper, MD as PCP - General (Internal Medicine)   HPI  Bianca Contreras is a 82 y.o. female Seen in follow-up for pacemaker implanted 2015 elsewhere-[employer].  GEN change 3/24 also atrial fibrillation by outside report.  On Eliquis no  bleeding  Remote syncope but has had none since.  My reading of the notes suggested that it may well have been neurally mediated.  Cardiac evaluation 2015 included a calcium score that was abnormal catheterization that was nonobstructive.  Echocardiogram normal  The patient denies chest pai, shortness of breath, nocturnal dyspnea, orthopnea .  There have been no palpitations, lightheadedness or syncope.  Complains of mild peripheral edema.  Diet is replete with sodium.  Eating out and adding on.    Date Cr K Hgb  3/23 0.85 4.6 12.2   3/24 0.86 4.4 11.9   Thromboembolic risk factors ( age  -2, HTN-1, TIA/CVA-2, Gender-1) for a CHADSVASc Score of >=6    Past Medical History:  Diagnosis Date   A-fib (HCC)    Allergic rhinitis    DDD (degenerative disc disease), lumbar    Exertional dyspnea    GERD (gastroesophageal reflux disease)    High cholesterol    Hypertension    Osteoarthritis    Osteoporosis    Pacemaker    Prediabetes    Schatzki's ring     Past Surgical History:  Procedure Laterality Date   BREAST BIOPSY     25 years ago cyst biopsy   INSERT / REPLACE / REMOVE PACEMAKER     PPM GENERATOR CHANGEOUT N/A 11/21/2022   Procedure: PPM GENERATOR CHANGEOUT;  Surgeon: Duke Salvia, MD;  Location: [facility] INVASIVE CV LAB;  Service: Cardiovascular;  Laterality: N/A;    Current Outpatient Medications  Medication Sig Dispense Refill   alendronate (FOSAMAX) 70 MG tablet Take 70 mg by mouth every Monday.     amLODipine (NORVASC) 2.5 MG tablet Take 1 tablet by mouth daily.     Ascorbic Acid (VITAMIN C) 1000 MG tablet Take 1,000 mg by mouth daily.     Calcium Carb-Cholecalciferol (CALCIUM 600  + D PO) Take 1 tablet by mouth daily.     Cholecalciferol (VITAMIN D) 50 MCG (2000 UT) CAPS Take 1 capsule by mouth daily.     diphenhydrAMINE (BENADRYL) 25 MG tablet Take 25 mg by mouth 2 (two) times daily.     ELIQUIS 5 MG TABS tablet Take 1 tablet by mouth 2 (two) times daily.     fluticasone (FLONASE) 50 MCG/ACT nasal spray Place 1 spray into both nostrils See admin instructions. Use 1 spray in each nostril daily, may use a second time as needed for allergies     Ginkgo Biloba 120 MG CAPS Take 120 mg by mouth daily.     latanoprost (XALATAN) 0.005 % ophthalmic solution Place 1 drop into both eyes at bedtime.     metoprolol tartrate (LOPRESSOR) 50 MG tablet Take 1 tablet by mouth 2 (two) times daily.     Naphazoline HCl (CLEAR EYES OP) Place 1 drop into both eyes daily.     omeprazole (PRILOSEC) 20 MG capsule Take 20 mg by mouth daily.     rosuvastatin (CRESTOR) 5 MG tablet Take 1 tablet by mouth daily.     vitamin B-12 (CYANOCOBALAMIN) 1000 MCG tablet Take 6,000 mcg by mouth daily.      No current facility-administered medications for this visit.    No Known Allergies  Review of Systems negative except from HPI and PMH  Physical Exam BP 128/74   Pulse 70   Ht 5\' 8"  (1.727 m)   Wt 165 lb 9.6 oz (75.1 kg)   SpO2 96%   BMI 25.18 kg/m  Well developed and well nourished in no acute distress HENT normal Neck supple with JVP-flat Clear Device pocket well healed; without hematoma or erythema.  There is no tethering  Regular rate and rhythm, no  gallop No  murmur Abd-soft with active BS No Clubbing cyanosis 1= edema Skin-warm and dry A & Oriented  Grossly normal sensory and motor function  ECG sinus at 70 Interval 17/08/38  Device function is normal. Programming changes   See Paceart for details     Assessment and  Plan  Syncope-probably neurally mediated   Dyspnea on exertion   Pacemaker-[employer]  Atrial fibrillation by outside report  No interval  syncope  Denies dyspnea.  Some edema. We have discussed the physiology of heart failure including the importance of salt restriction and fluid restriction and have reviewed sources of dietary salt and water.  And the potential benefits of salt substitutes.  Advised her that if she starts a salt substitute she should let us or her primary care doctor know so that we can check her potassium.  Otherwise maybe Bianca Contreras is better.  Device has detected some nonsustained atrial fibrillation.  Will continue  apixaban

## 2023-03-13 DIAGNOSIS — I10 Essential (primary) hypertension: Secondary | ICD-10-CM | POA: Diagnosis not present

## 2023-03-13 DIAGNOSIS — E7800 Pure hypercholesterolemia, unspecified: Secondary | ICD-10-CM | POA: Diagnosis not present

## 2023-03-13 DIAGNOSIS — D6869 Other thrombophilia: Secondary | ICD-10-CM | POA: Diagnosis not present

## 2023-03-13 DIAGNOSIS — Z950 Presence of cardiac pacemaker: Secondary | ICD-10-CM | POA: Diagnosis not present

## 2023-03-13 DIAGNOSIS — I4891 Unspecified atrial fibrillation: Secondary | ICD-10-CM | POA: Diagnosis not present

## 2023-03-13 DIAGNOSIS — R7303 Prediabetes: Secondary | ICD-10-CM | POA: Diagnosis not present

## 2023-03-27 ENCOUNTER — Ambulatory Visit (INDEPENDENT_AMBULATORY_CARE_PROVIDER_SITE_OTHER): Payer: Medicare Other

## 2023-03-27 DIAGNOSIS — I4891 Unspecified atrial fibrillation: Secondary | ICD-10-CM | POA: Diagnosis not present

## 2023-03-27 DIAGNOSIS — R001 Bradycardia, unspecified: Secondary | ICD-10-CM

## 2023-03-28 LAB — CUP PACEART REMOTE DEVICE CHECK
Battery Remaining Longevity: 180 mo
Battery Remaining Percentage: 100 %
Brady Statistic RA Percent Paced: 7 %
Brady Statistic RV Percent Paced: 0 %
Date Time Interrogation Session: 20240723061400
Implantable Lead Connection Status: 753985
Implantable Lead Connection Status: 753985
Implantable Lead Implant Date: 20150818
Implantable Lead Implant Date: 20150818
Implantable Lead Location: 753859
Implantable Lead Location: 753860
Implantable Lead Model: 4135
Implantable Lead Model: 4136
Implantable Lead Serial Number: 29563765
Implantable Lead Serial Number: 29591923
Implantable Pulse Generator Implant Date: 20240318
Lead Channel Impedance Value: 499 Ohm
Lead Channel Impedance Value: 554 Ohm
Lead Channel Pacing Threshold Amplitude: 0.4 V
Lead Channel Pacing Threshold Pulse Width: 0.4 ms
Lead Channel Setting Pacing Amplitude: 2 V
Lead Channel Setting Pacing Amplitude: 2 V
Lead Channel Setting Pacing Pulse Width: 0.4 ms
Lead Channel Setting Sensing Sensitivity: 2.5 mV
Pulse Gen Serial Number: 602911
Zone Setting Status: 755011

## 2023-03-30 ENCOUNTER — Ambulatory Visit: Payer: Medicare Other

## 2023-04-06 NOTE — Progress Notes (Signed)
Remote pacemaker transmission.   

## 2023-05-01 ENCOUNTER — Ambulatory Visit: Payer: Medicare Other

## 2023-06-17 IMAGING — MG MM DIGITAL SCREENING BILAT W/ TOMO AND CAD
6 of 10 series · 6 of 30 positions shown · non-contrast
Comparison: Previous exam(s).

ACR Breast Density Category a: The breast tissue is almost entirely
fatty.

CLINICAL DATA: Screening.

EXAM:
DIGITAL SCREENING BILATERAL MAMMOGRAM WITH TOMOSYNTHESIS AND CAD
TECHNIQUE: Bilateral screening digital craniocaudal and mediolateral oblique
mammograms were obtained. Bilateral screening digital breast
tomosynthesis was performed. The images were evaluated with
computer-aided detection.

[L MLO synth-2D (1 of 2)]
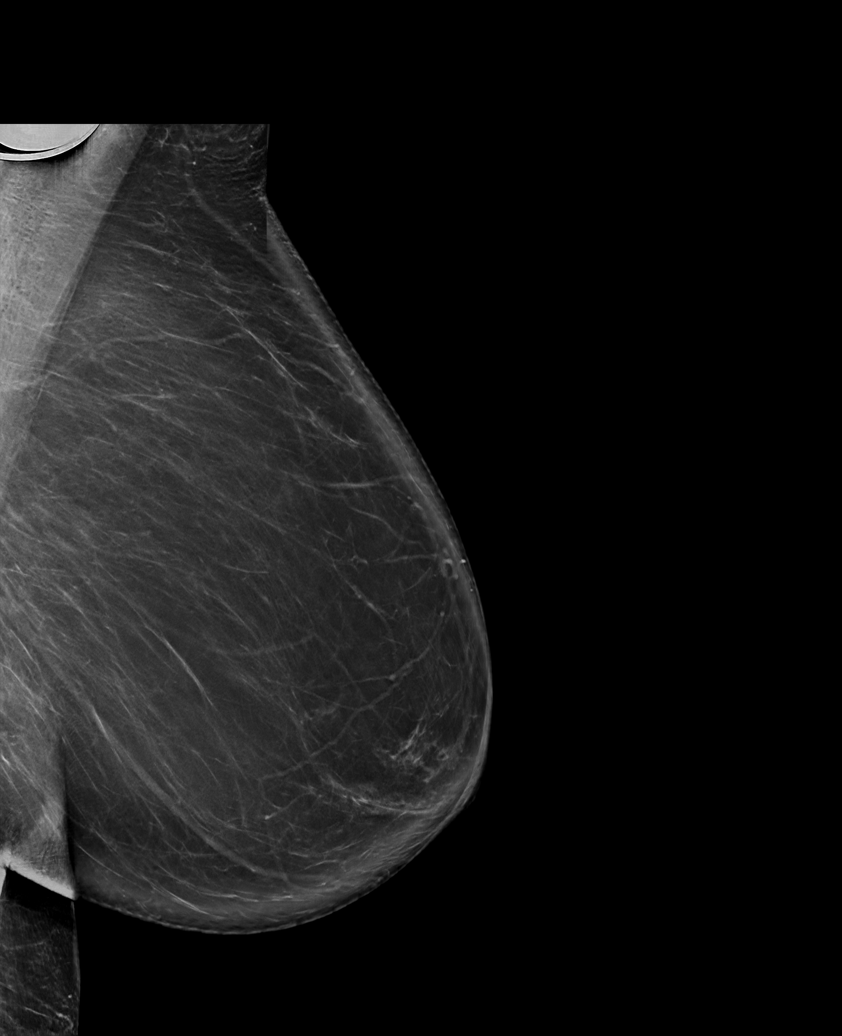

[R CC synth-2D]
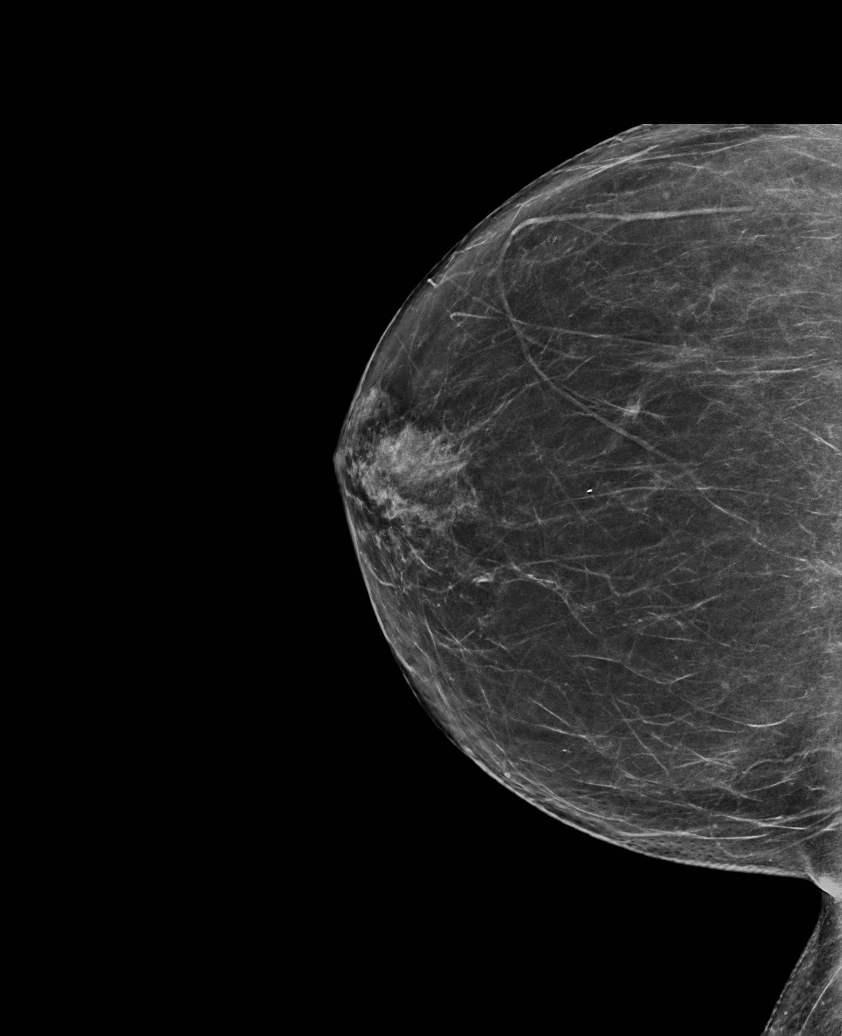

[L MLO synth-2D (2 of 2)]
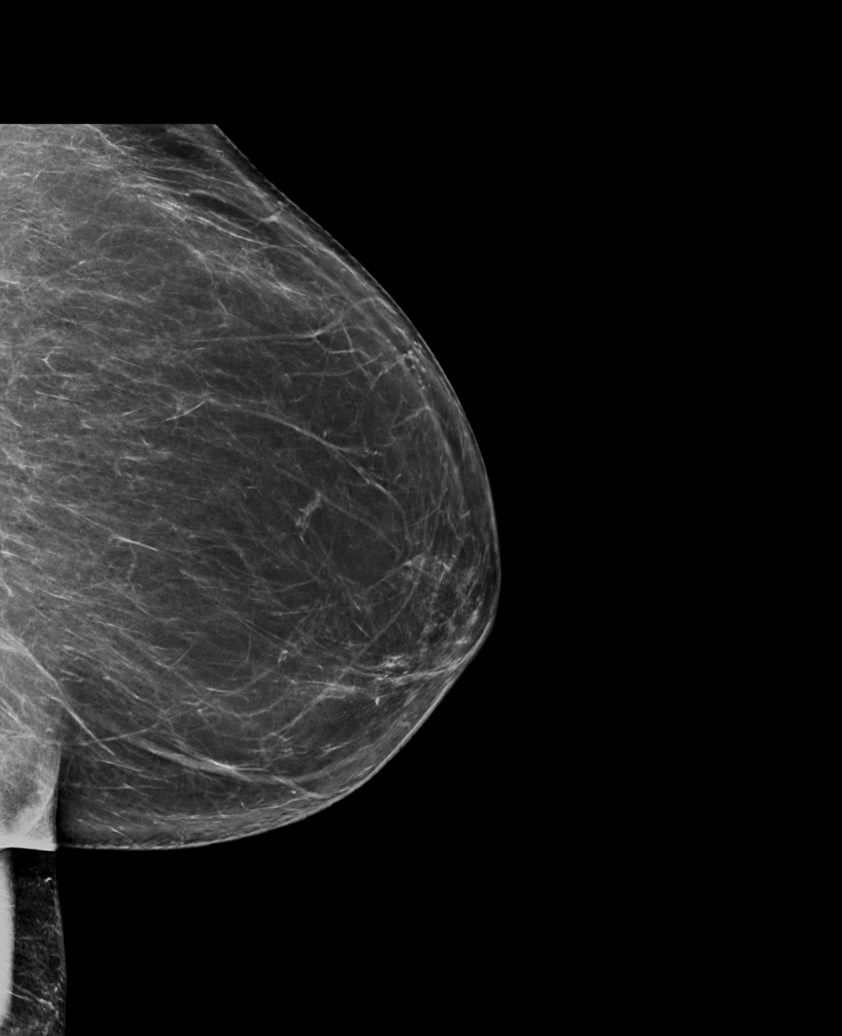

[R MLO synth-2D]
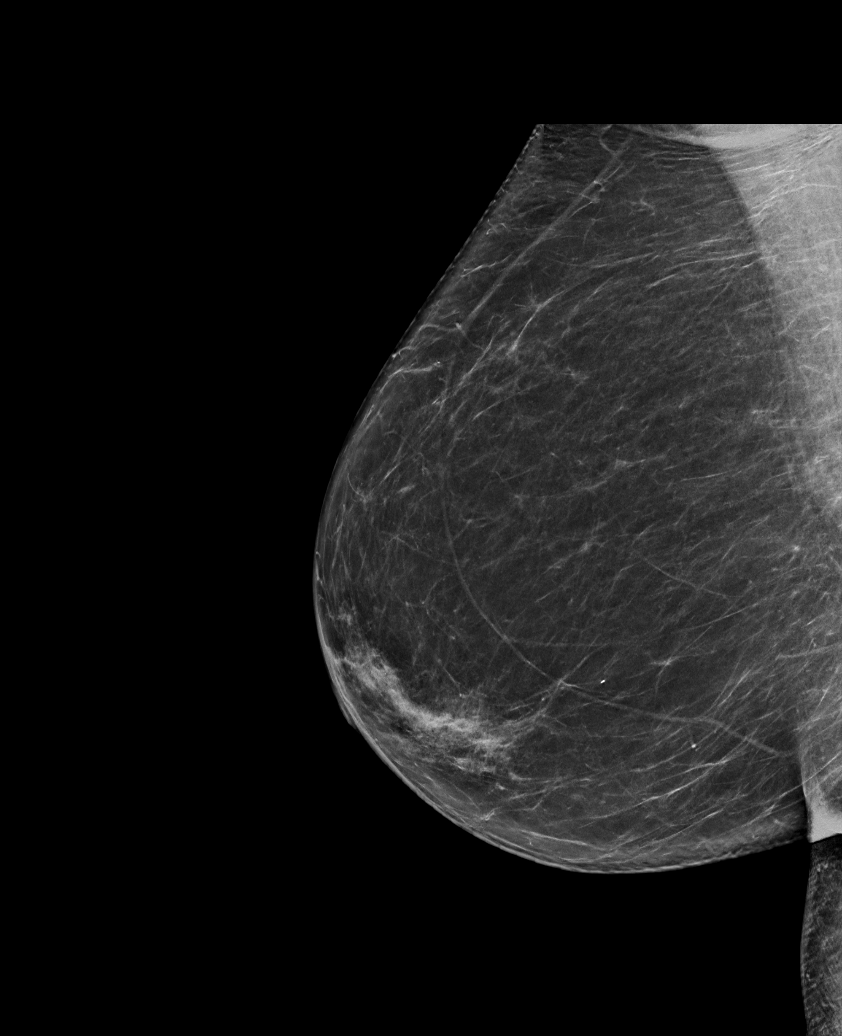

[L CC synth-2D]
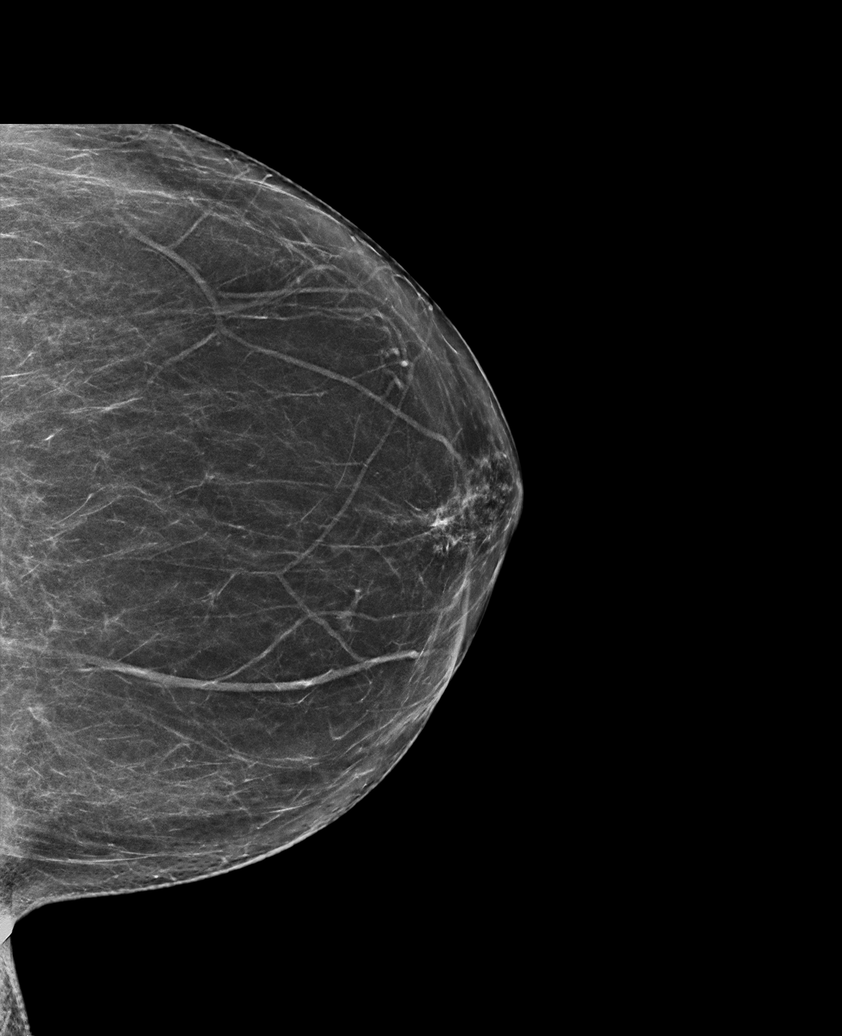

[R MLO tomo · tomo slice 39/76.0]
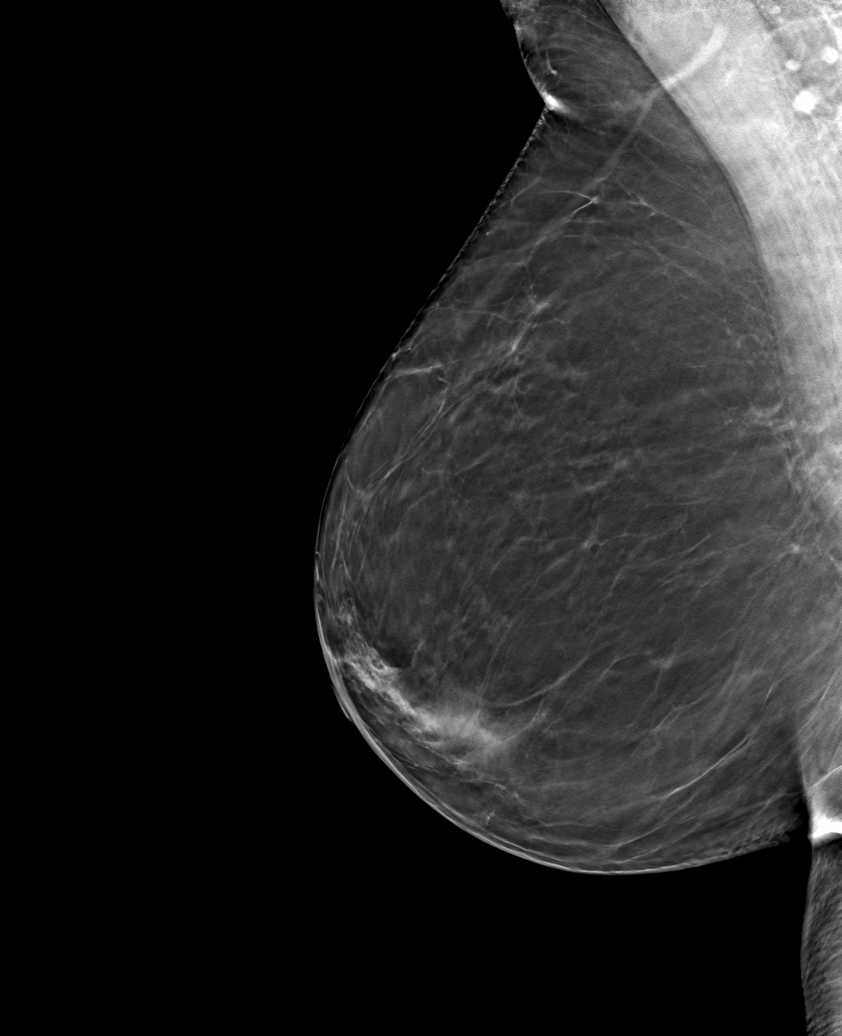

[6 of 30 positions shown; findings below may reference images not displayed]

FINDINGS: There are no findings suspicious for malignancy.
IMPRESSION: No mammographic evidence of malignancy. A result letter of this
screening mammogram will be mailed directly to the patient.

RECOMMENDATION:
Screening mammogram in one year. (Code:0E-3-N98)

BI-RADS CATEGORY  1: Negative.

## 2023-06-19 DIAGNOSIS — Z23 Encounter for immunization: Secondary | ICD-10-CM | POA: Diagnosis not present

## 2023-06-19 DIAGNOSIS — E7800 Pure hypercholesterolemia, unspecified: Secondary | ICD-10-CM | POA: Diagnosis not present

## 2023-06-19 DIAGNOSIS — D649 Anemia, unspecified: Secondary | ICD-10-CM | POA: Diagnosis not present

## 2023-06-19 DIAGNOSIS — M810 Age-related osteoporosis without current pathological fracture: Secondary | ICD-10-CM | POA: Diagnosis not present

## 2023-06-19 DIAGNOSIS — R7303 Prediabetes: Secondary | ICD-10-CM | POA: Diagnosis not present

## 2023-06-19 DIAGNOSIS — Z950 Presence of cardiac pacemaker: Secondary | ICD-10-CM | POA: Diagnosis not present

## 2023-06-19 DIAGNOSIS — D6869 Other thrombophilia: Secondary | ICD-10-CM | POA: Diagnosis not present

## 2023-06-19 DIAGNOSIS — G250 Essential tremor: Secondary | ICD-10-CM | POA: Diagnosis not present

## 2023-06-19 DIAGNOSIS — I10 Essential (primary) hypertension: Secondary | ICD-10-CM | POA: Diagnosis not present

## 2023-06-19 DIAGNOSIS — I4891 Unspecified atrial fibrillation: Secondary | ICD-10-CM | POA: Diagnosis not present

## 2023-06-21 DIAGNOSIS — Z23 Encounter for immunization: Secondary | ICD-10-CM | POA: Diagnosis not present

## 2023-07-10 ENCOUNTER — Other Ambulatory Visit: Payer: Self-pay | Admitting: Internal Medicine

## 2023-07-10 DIAGNOSIS — Z1231 Encounter for screening mammogram for malignant neoplasm of breast: Secondary | ICD-10-CM

## 2023-07-12 ENCOUNTER — Ambulatory Visit
Admission: RE | Admit: 2023-07-12 | Discharge: 2023-07-12 | Disposition: A | Discharge status: Home / Self Care | Payer: Medicare Other | Source: Ambulatory Visit | Attending: Internal Medicine | Admitting: Internal Medicine

## 2023-07-12 DIAGNOSIS — Z1231 Encounter for screening mammogram for malignant neoplasm of breast: Secondary | ICD-10-CM

## 2023-09-21 DIAGNOSIS — H40053 Ocular hypertension, bilateral: Secondary | ICD-10-CM | POA: Diagnosis not present

## 2023-09-25 ENCOUNTER — Ambulatory Visit (INDEPENDENT_AMBULATORY_CARE_PROVIDER_SITE_OTHER): Payer: Medicare Other

## 2023-09-25 DIAGNOSIS — R001 Bradycardia, unspecified: Secondary | ICD-10-CM

## 2023-09-26 LAB — CUP PACEART REMOTE DEVICE CHECK
Battery Remaining Longevity: 180 mo
Battery Remaining Percentage: 100 %
Brady Statistic RA Percent Paced: 8 %
Brady Statistic RV Percent Paced: 0 %
Date Time Interrogation Session: 20250120132900
Implantable Lead Connection Status: 753985
Implantable Lead Connection Status: 753985
Implantable Lead Implant Date: 20150818
Implantable Lead Implant Date: 20150818
Implantable Lead Location: 753859
Implantable Lead Location: 753860
Implantable Lead Model: 4135
Implantable Lead Model: 4136
Implantable Lead Serial Number: 29563765
Implantable Lead Serial Number: 29591923
Implantable Pulse Generator Implant Date: 20240318
Lead Channel Impedance Value: 501 Ohm
Lead Channel Impedance Value: 555 Ohm
Lead Channel Pacing Threshold Amplitude: 0.4 V
Lead Channel Pacing Threshold Pulse Width: 0.4 ms
Lead Channel Setting Pacing Amplitude: 2 V
Lead Channel Setting Pacing Amplitude: 2 V
Lead Channel Setting Pacing Pulse Width: 0.4 ms
Lead Channel Setting Sensing Sensitivity: 2.5 mV
Pulse Gen Serial Number: 602911
Zone Setting Status: 755011

## 2023-11-01 ENCOUNTER — Encounter: Payer: Self-pay | Admitting: Internal Medicine

## 2023-11-02 NOTE — Addendum Note (Signed)
 Addended by: Geralyn Flash D on: 11/02/2023 11:03 AM   Modules accepted: Orders

## 2023-11-02 NOTE — Progress Notes (Signed)
 Remote pacemaker transmission.

## 2023-11-24 NOTE — Progress Notes (Signed)
  Electrophysiology Office Note:   ID:  Bianca Contreras, DOB 1940-10-18, MRN [id number]  Primary Cardiologist: None Electrophysiologist: Sherryl Manges, MD      History of Present Illness:   Bianca Contreras is a 83 y.o. female with h/o symptomatic bradycardia and paroxysmal AF seen today for routine electrophysiology followup.   Since last being seen in our clinic the patient reports doing well.  she denies chest pain, palpitations, dyspnea, PND, orthopnea, nausea, vomiting, dizziness, syncope, edema, weight gain, or early satiety.   Review of systems complete and found to be negative unless listed in HPI.   EP Information / Studies Reviewed:    EKG is ordered today. Personal review as below.  EKG Interpretation Date/Time:  Tuesday November 28 2023 10:43:26 EDT Ventricular Rate:  64 PR Interval:  176 QRS Duration:  76 QT Interval:  402 QTC Calculation: 414 R Axis:   -1  Text Interpretation: Normal sinus rhythm Nonspecific T wave abnormality When compared with ECG of 28-Feb-2023 14:26, No significant change was found Confirmed by Maxine Glenn [phone]) on 11/28/2023 10:49:09 AM    PPM Interrogation-  reviewed in detail today,  See PACEART report.  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    Physical Exam:   VS:  BP 110/66   Pulse 64   Ht 5\' 8"  (1.727 m)   Wt 166 lb 3.2 oz (75.4 kg)   SpO2 93%   BMI 25.27 kg/m    Wt Readings from Last 3 Encounters:  11/28/23 166 lb 3.2 oz (75.4 kg)  02/28/23 165 lb 9.6 oz (75.1 kg)  11/21/22 161 lb (73 kg)     GEN: No acute distress  NECK: No JVD; No carotid bruits CARDIAC: Regular rate and rhythm, no murmurs, rubs, gallops RESPIRATORY:  Clear to auscultation without rales, wheezing or rhonchi  ABDOMEN: Soft, non-tender, non-distended EXTREMITIES:  No edema; No deformity   ASSESSMENT AND PLAN:    Symptomatic bradycardia s/p [employer] PPM  Normal PPM function See Pace Art  report No changes today  Paroxysmal AF Low burden Continue eliquis 5 mg BID for CHA2DS2/VASc of at least 4.  Labs today.    Disposition:   Follow up with EP APP in 12 months  Signed, Graciella Freer, PA-C

## 2023-11-28 ENCOUNTER — Ambulatory Visit: Payer: Medicare Other | Attending: Student | Admitting: Student

## 2023-11-28 ENCOUNTER — Encounter: Payer: Self-pay | Admitting: Student

## 2023-11-28 VITALS — BP 110/66 | HR 64 | Ht 68.0 in | Wt 166.2 lb

## 2023-11-28 DIAGNOSIS — I4891 Unspecified atrial fibrillation: Secondary | ICD-10-CM | POA: Diagnosis not present

## 2023-11-28 DIAGNOSIS — R001 Bradycardia, unspecified: Secondary | ICD-10-CM | POA: Insufficient documentation

## 2023-11-28 DIAGNOSIS — I480 Paroxysmal atrial fibrillation: Secondary | ICD-10-CM | POA: Diagnosis not present

## 2023-11-28 LAB — CUP PACEART INCLINIC DEVICE CHECK
Date Time Interrogation Session: 20250325105915
Implantable Lead Connection Status: 753985
Implantable Lead Connection Status: 753985
Implantable Lead Implant Date: 20150818
Implantable Lead Implant Date: 20150818
Implantable Lead Location: 753859
Implantable Lead Location: 753860
Implantable Lead Model: 4135
Implantable Lead Model: 4136
Implantable Lead Serial Number: 29563765
Implantable Lead Serial Number: 29591923
Implantable Pulse Generator Implant Date: 20240318
Lead Channel Impedance Value: 494 Ohm
Lead Channel Impedance Value: 554 Ohm
Lead Channel Pacing Threshold Amplitude: 0.6 V
Lead Channel Pacing Threshold Amplitude: 0.8 V
Lead Channel Pacing Threshold Pulse Width: 0.4 ms
Lead Channel Pacing Threshold Pulse Width: 0.4 ms
Lead Channel Sensing Intrinsic Amplitude: 14.1 mV
Lead Channel Sensing Intrinsic Amplitude: 4.9 mV
Lead Channel Setting Pacing Amplitude: 2 V
Lead Channel Setting Pacing Amplitude: 2 V
Lead Channel Setting Pacing Pulse Width: 0.4 ms
Lead Channel Setting Sensing Sensitivity: 2.5 mV
Pulse Gen Serial Number: 602911
Zone Setting Status: 755011

## 2023-11-28 NOTE — Patient Instructions (Signed)
 Medication Instructions:  Your physician recommends that you continue on your current medications as directed. Please refer to the Current Medication list given to you today.  *If you need a refill on your cardiac medications before your next appointment, please call your pharmacy*  Lab Work: BMET, CBC-TODAY If you have labs (blood work) drawn today and your tests are completely normal, you will receive your results only by: MyChart Message (if you have MyChart) OR A paper copy in the mail If you have any lab test that is abnormal or we need to change your treatment, we will call you to review the results.  Follow-Up: At [facility], you and your health needs are our priority.  As part of our continuing mission to provide you with exceptional heart care, we have created designated Provider Care Teams.  These Care Teams include your primary Cardiologist (physician) and Advanced Practice Providers (APPs -  Physician Assistants and Nurse Practitioners) who all work together to provide you with the care you need, when you need it.  Your next appointment:   1 year(s)  Provider:   Casimiro Needle "Otilio Saber, PA-C

## 2023-11-29 LAB — CBC
Hematocrit: 37.6 % (ref 34.0–46.6)
Hemoglobin: 12.4 g/dL (ref 11.1–15.9)
MCH: 30 pg (ref 26.6–33.0)
MCHC: 33 g/dL (ref 31.5–35.7)
MCV: 91 fL (ref 79–97)
Platelets: 287 10*3/uL (ref 150–450)
RBC: 4.14 x10E6/uL (ref 3.77–5.28)
RDW: 13.7 % (ref 11.7–15.4)
WBC: 4.4 10*3/uL (ref 3.4–10.8)

## 2023-11-29 LAB — BASIC METABOLIC PANEL
BUN/Creatinine Ratio: 12 (ref 12–28)
BUN: 10 mg/dL (ref 8–27)
CO2: 24 mmol/L (ref 20–29)
Calcium: 9.1 mg/dL (ref 8.7–10.3)
Chloride: 105 mmol/L (ref 96–106)
Creatinine, Ser: 0.85 mg/dL (ref 0.57–1.00)
Glucose: 85 mg/dL (ref 70–99)
Potassium: 4.2 mmol/L (ref 3.5–5.2)
Sodium: 142 mmol/L (ref 134–144)
eGFR: 68 mL/min/{1.73_m2} (ref >59)

## 2023-12-20 ENCOUNTER — Other Ambulatory Visit: Payer: Self-pay | Admitting: Internal Medicine

## 2023-12-20 DIAGNOSIS — Z0000 Encounter for general adult medical examination without abnormal findings: Secondary | ICD-10-CM | POA: Diagnosis not present

## 2023-12-20 DIAGNOSIS — D6869 Other thrombophilia: Secondary | ICD-10-CM | POA: Diagnosis not present

## 2023-12-20 DIAGNOSIS — Z1331 Encounter for screening for depression: Secondary | ICD-10-CM | POA: Diagnosis not present

## 2023-12-20 DIAGNOSIS — I4891 Unspecified atrial fibrillation: Secondary | ICD-10-CM | POA: Diagnosis not present

## 2023-12-20 DIAGNOSIS — M810 Age-related osteoporosis without current pathological fracture: Secondary | ICD-10-CM | POA: Diagnosis not present

## 2023-12-20 DIAGNOSIS — E7800 Pure hypercholesterolemia, unspecified: Secondary | ICD-10-CM | POA: Diagnosis not present

## 2023-12-20 DIAGNOSIS — I10 Essential (primary) hypertension: Secondary | ICD-10-CM | POA: Diagnosis not present

## 2023-12-20 DIAGNOSIS — D649 Anemia, unspecified: Secondary | ICD-10-CM | POA: Diagnosis not present

## 2023-12-20 DIAGNOSIS — K219 Gastro-esophageal reflux disease without esophagitis: Secondary | ICD-10-CM | POA: Diagnosis not present

## 2023-12-20 DIAGNOSIS — R7303 Prediabetes: Secondary | ICD-10-CM | POA: Diagnosis not present

## 2023-12-20 DIAGNOSIS — Z23 Encounter for immunization: Secondary | ICD-10-CM | POA: Diagnosis not present

## 2023-12-20 DIAGNOSIS — M519 Unspecified thoracic, thoracolumbar and lumbosacral intervertebral disc disorder: Secondary | ICD-10-CM | POA: Diagnosis not present

## 2023-12-25 ENCOUNTER — Ambulatory Visit: Payer: Medicare Other

## 2023-12-25 DIAGNOSIS — I4891 Unspecified atrial fibrillation: Secondary | ICD-10-CM | POA: Diagnosis not present

## 2023-12-25 LAB — CUP PACEART REMOTE DEVICE CHECK
Battery Remaining Longevity: 174 mo
Battery Remaining Percentage: 100 %
Brady Statistic RA Percent Paced: 11 %
Brady Statistic RV Percent Paced: 1 %
Date Time Interrogation Session: 20250421055200
Implantable Lead Connection Status: 753985
Implantable Lead Connection Status: 753985
Implantable Lead Implant Date: 20150818
Implantable Lead Implant Date: 20150818
Implantable Lead Location: 753859
Implantable Lead Location: 753860
Implantable Lead Model: 4135
Implantable Lead Model: 4136
Implantable Lead Serial Number: 29563765
Implantable Lead Serial Number: 29591923
Implantable Pulse Generator Implant Date: 20240318
Lead Channel Impedance Value: 484 Ohm
Lead Channel Impedance Value: 544 Ohm
Lead Channel Pacing Threshold Amplitude: 0.4 V
Lead Channel Pacing Threshold Pulse Width: 0.4 ms
Lead Channel Setting Pacing Amplitude: 2 V
Lead Channel Setting Pacing Amplitude: 2 V
Lead Channel Setting Pacing Pulse Width: 0.4 ms
Lead Channel Setting Sensing Sensitivity: 2.5 mV
Pulse Gen Serial Number: 602911
Zone Setting Status: 755011

## 2023-12-27 DIAGNOSIS — I10 Essential (primary) hypertension: Secondary | ICD-10-CM | POA: Diagnosis not present

## 2023-12-27 DIAGNOSIS — I4891 Unspecified atrial fibrillation: Secondary | ICD-10-CM | POA: Diagnosis not present

## 2024-01-03 DIAGNOSIS — I4891 Unspecified atrial fibrillation: Secondary | ICD-10-CM | POA: Diagnosis not present

## 2024-01-03 DIAGNOSIS — M810 Age-related osteoporosis without current pathological fracture: Secondary | ICD-10-CM | POA: Diagnosis not present

## 2024-01-03 DIAGNOSIS — I10 Essential (primary) hypertension: Secondary | ICD-10-CM | POA: Diagnosis not present

## 2024-01-03 DIAGNOSIS — E7800 Pure hypercholesterolemia, unspecified: Secondary | ICD-10-CM | POA: Diagnosis not present

## 2024-01-25 DIAGNOSIS — I4891 Unspecified atrial fibrillation: Secondary | ICD-10-CM | POA: Diagnosis not present

## 2024-01-25 DIAGNOSIS — I10 Essential (primary) hypertension: Secondary | ICD-10-CM | POA: Diagnosis not present

## 2024-02-03 DIAGNOSIS — M810 Age-related osteoporosis without current pathological fracture: Secondary | ICD-10-CM | POA: Diagnosis not present

## 2024-02-03 DIAGNOSIS — I4891 Unspecified atrial fibrillation: Secondary | ICD-10-CM | POA: Diagnosis not present

## 2024-02-03 DIAGNOSIS — I10 Essential (primary) hypertension: Secondary | ICD-10-CM | POA: Diagnosis not present

## 2024-02-03 DIAGNOSIS — E7800 Pure hypercholesterolemia, unspecified: Secondary | ICD-10-CM | POA: Diagnosis not present

## 2024-02-09 NOTE — Progress Notes (Signed)
 Remote pacemaker transmission.

## 2024-02-24 DIAGNOSIS — I4891 Unspecified atrial fibrillation: Secondary | ICD-10-CM | POA: Diagnosis not present

## 2024-02-24 DIAGNOSIS — I10 Essential (primary) hypertension: Secondary | ICD-10-CM | POA: Diagnosis not present

## 2024-03-04 DIAGNOSIS — I10 Essential (primary) hypertension: Secondary | ICD-10-CM | POA: Diagnosis not present

## 2024-03-04 DIAGNOSIS — M810 Age-related osteoporosis without current pathological fracture: Secondary | ICD-10-CM | POA: Diagnosis not present

## 2024-03-04 DIAGNOSIS — E7800 Pure hypercholesterolemia, unspecified: Secondary | ICD-10-CM | POA: Diagnosis not present

## 2024-03-04 DIAGNOSIS — I4891 Unspecified atrial fibrillation: Secondary | ICD-10-CM | POA: Diagnosis not present

## 2024-03-25 ENCOUNTER — Ambulatory Visit: Payer: Medicare Other

## 2024-03-25 DIAGNOSIS — I4891 Unspecified atrial fibrillation: Secondary | ICD-10-CM | POA: Diagnosis not present

## 2024-03-25 DIAGNOSIS — I10 Essential (primary) hypertension: Secondary | ICD-10-CM | POA: Diagnosis not present

## 2024-03-28 ENCOUNTER — Ambulatory Visit: Payer: Self-pay | Admitting: Cardiology

## 2024-03-28 LAB — CUP PACEART REMOTE DEVICE CHECK
Battery Remaining Longevity: 180 mo
Battery Remaining Percentage: 100 %
Brady Statistic RA Percent Paced: 10 %
Brady Statistic RV Percent Paced: 1 %
Date Time Interrogation Session: 20250723145800
Implantable Lead Connection Status: 753985
Implantable Lead Connection Status: 753985
Implantable Lead Implant Date: 20150818
Implantable Lead Implant Date: 20150818
Implantable Lead Location: 753859
Implantable Lead Location: 753860
Implantable Lead Model: 4135
Implantable Lead Model: 4136
Implantable Lead Serial Number: 29563765
Implantable Lead Serial Number: 29591923
Implantable Pulse Generator Implant Date: 20240318
Lead Channel Impedance Value: 467 Ohm
Lead Channel Impedance Value: 514 Ohm
Lead Channel Pacing Threshold Amplitude: 0.5 V
Lead Channel Pacing Threshold Pulse Width: 0.4 ms
Lead Channel Setting Pacing Amplitude: 2 V
Lead Channel Setting Pacing Amplitude: 2 V
Lead Channel Setting Pacing Pulse Width: 0.4 ms
Lead Channel Setting Sensing Sensitivity: 2.5 mV
Pulse Gen Serial Number: 602911
Zone Setting Status: 755011

## 2024-04-04 DIAGNOSIS — E7800 Pure hypercholesterolemia, unspecified: Secondary | ICD-10-CM | POA: Diagnosis not present

## 2024-04-04 DIAGNOSIS — I10 Essential (primary) hypertension: Secondary | ICD-10-CM | POA: Diagnosis not present

## 2024-04-04 DIAGNOSIS — M810 Age-related osteoporosis without current pathological fracture: Secondary | ICD-10-CM | POA: Diagnosis not present

## 2024-04-04 DIAGNOSIS — I4891 Unspecified atrial fibrillation: Secondary | ICD-10-CM | POA: Diagnosis not present

## 2024-04-15 ENCOUNTER — Ambulatory Visit (HOSPITAL_BASED_OUTPATIENT_CLINIC_OR_DEPARTMENT_OTHER)
Admission: RE | Admit: 2024-04-15 | Discharge: 2024-04-15 | Disposition: A | Discharge status: Home / Self Care | Source: Ambulatory Visit | Attending: Internal Medicine | Admitting: Internal Medicine

## 2024-04-15 DIAGNOSIS — M810 Age-related osteoporosis without current pathological fracture: Secondary | ICD-10-CM | POA: Diagnosis not present

## 2024-04-15 DIAGNOSIS — Z780 Asymptomatic menopausal state: Secondary | ICD-10-CM | POA: Diagnosis not present

## 2024-04-15 DIAGNOSIS — M8589 Other specified disorders of bone density and structure, multiple sites: Secondary | ICD-10-CM | POA: Diagnosis not present

## 2024-04-23 DIAGNOSIS — M8588 Other specified disorders of bone density and structure, other site: Secondary | ICD-10-CM | POA: Diagnosis not present

## 2024-04-23 DIAGNOSIS — L918 Other hypertrophic disorders of the skin: Secondary | ICD-10-CM | POA: Diagnosis not present

## 2024-04-24 DIAGNOSIS — I10 Essential (primary) hypertension: Secondary | ICD-10-CM | POA: Diagnosis not present

## 2024-04-24 DIAGNOSIS — I4891 Unspecified atrial fibrillation: Secondary | ICD-10-CM | POA: Diagnosis not present

## 2024-05-05 DIAGNOSIS — M810 Age-related osteoporosis without current pathological fracture: Secondary | ICD-10-CM | POA: Diagnosis not present

## 2024-05-05 DIAGNOSIS — E7800 Pure hypercholesterolemia, unspecified: Secondary | ICD-10-CM | POA: Diagnosis not present

## 2024-05-05 DIAGNOSIS — I4891 Unspecified atrial fibrillation: Secondary | ICD-10-CM | POA: Diagnosis not present

## 2024-05-05 DIAGNOSIS — I10 Essential (primary) hypertension: Secondary | ICD-10-CM | POA: Diagnosis not present

## 2024-05-24 DIAGNOSIS — I4891 Unspecified atrial fibrillation: Secondary | ICD-10-CM | POA: Diagnosis not present

## 2024-05-24 DIAGNOSIS — I10 Essential (primary) hypertension: Secondary | ICD-10-CM | POA: Diagnosis not present

## 2024-06-04 DIAGNOSIS — I10 Essential (primary) hypertension: Secondary | ICD-10-CM | POA: Diagnosis not present

## 2024-06-04 DIAGNOSIS — M810 Age-related osteoporosis without current pathological fracture: Secondary | ICD-10-CM | POA: Diagnosis not present

## 2024-06-04 DIAGNOSIS — E7800 Pure hypercholesterolemia, unspecified: Secondary | ICD-10-CM | POA: Diagnosis not present

## 2024-06-04 DIAGNOSIS — I4891 Unspecified atrial fibrillation: Secondary | ICD-10-CM | POA: Diagnosis not present

## 2024-06-10 ENCOUNTER — Other Ambulatory Visit: Payer: Self-pay | Admitting: Internal Medicine

## 2024-06-10 DIAGNOSIS — Z1231 Encounter for screening mammogram for malignant neoplasm of breast: Secondary | ICD-10-CM

## 2024-06-10 NOTE — Progress Notes (Signed)
 Remote PPM Transmission

## 2024-06-19 DIAGNOSIS — H9319 Tinnitus, unspecified ear: Secondary | ICD-10-CM | POA: Diagnosis not present

## 2024-06-19 DIAGNOSIS — M810 Age-related osteoporosis without current pathological fracture: Secondary | ICD-10-CM | POA: Diagnosis not present

## 2024-06-19 DIAGNOSIS — E7800 Pure hypercholesterolemia, unspecified: Secondary | ICD-10-CM | POA: Diagnosis not present

## 2024-06-19 DIAGNOSIS — M519 Unspecified thoracic, thoracolumbar and lumbosacral intervertebral disc disorder: Secondary | ICD-10-CM | POA: Diagnosis not present

## 2024-06-19 DIAGNOSIS — I10 Essential (primary) hypertension: Secondary | ICD-10-CM | POA: Diagnosis not present

## 2024-06-19 DIAGNOSIS — Z950 Presence of cardiac pacemaker: Secondary | ICD-10-CM | POA: Diagnosis not present

## 2024-06-19 DIAGNOSIS — I4891 Unspecified atrial fibrillation: Secondary | ICD-10-CM | POA: Diagnosis not present

## 2024-06-19 DIAGNOSIS — D6869 Other thrombophilia: Secondary | ICD-10-CM | POA: Diagnosis not present

## 2024-06-19 DIAGNOSIS — Z23 Encounter for immunization: Secondary | ICD-10-CM | POA: Diagnosis not present

## 2024-06-19 DIAGNOSIS — R269 Unspecified abnormalities of gait and mobility: Secondary | ICD-10-CM | POA: Diagnosis not present

## 2024-06-19 DIAGNOSIS — M1990 Unspecified osteoarthritis, unspecified site: Secondary | ICD-10-CM | POA: Diagnosis not present

## 2024-06-19 DIAGNOSIS — R7303 Prediabetes: Secondary | ICD-10-CM | POA: Diagnosis not present

## 2024-06-23 DIAGNOSIS — I4891 Unspecified atrial fibrillation: Secondary | ICD-10-CM | POA: Diagnosis not present

## 2024-06-23 DIAGNOSIS — I10 Essential (primary) hypertension: Secondary | ICD-10-CM | POA: Diagnosis not present

## 2024-06-24 ENCOUNTER — Ambulatory Visit (INDEPENDENT_AMBULATORY_CARE_PROVIDER_SITE_OTHER): Payer: Medicare Other

## 2024-06-24 DIAGNOSIS — I4891 Unspecified atrial fibrillation: Secondary | ICD-10-CM | POA: Diagnosis not present

## 2024-06-27 LAB — CUP PACEART REMOTE DEVICE CHECK
Battery Remaining Longevity: 162 mo
Battery Remaining Percentage: 100 %
Brady Statistic RA Percent Paced: 10 %
Brady Statistic RV Percent Paced: 2 %
Date Time Interrogation Session: 20251022200300
Implantable Lead Connection Status: 753985
Implantable Lead Connection Status: 753985
Implantable Lead Implant Date: 20150818
Implantable Lead Implant Date: 20150818
Implantable Lead Location: 753859
Implantable Lead Location: 753860
Implantable Lead Model: 4135
Implantable Lead Model: 4136
Implantable Lead Serial Number: 29563765
Implantable Lead Serial Number: 29591923
Implantable Pulse Generator Implant Date: 20240318
Lead Channel Impedance Value: 502 Ohm
Lead Channel Impedance Value: 521 Ohm
Lead Channel Pacing Threshold Amplitude: 0.5 V
Lead Channel Pacing Threshold Pulse Width: 0.4 ms
Lead Channel Setting Pacing Amplitude: 2 V
Lead Channel Setting Pacing Amplitude: 2 V
Lead Channel Setting Pacing Pulse Width: 0.4 ms
Lead Channel Setting Sensing Sensitivity: 2.5 mV
Pulse Gen Serial Number: 602911
Zone Setting Status: 755011

## 2024-06-28 NOTE — Progress Notes (Signed)
 Remote PPM Transmission

## 2024-07-01 ENCOUNTER — Ambulatory Visit: Payer: Self-pay | Admitting: Cardiology

## 2024-07-05 DIAGNOSIS — M810 Age-related osteoporosis without current pathological fracture: Secondary | ICD-10-CM | POA: Diagnosis not present

## 2024-07-05 DIAGNOSIS — E7800 Pure hypercholesterolemia, unspecified: Secondary | ICD-10-CM | POA: Diagnosis not present

## 2024-07-05 DIAGNOSIS — I10 Essential (primary) hypertension: Secondary | ICD-10-CM | POA: Diagnosis not present

## 2024-07-05 DIAGNOSIS — I4891 Unspecified atrial fibrillation: Secondary | ICD-10-CM | POA: Diagnosis not present

## 2024-07-16 ENCOUNTER — Ambulatory Visit
Admission: RE | Admit: 2024-07-16 | Discharge: 2024-07-16 | Disposition: A | Source: Ambulatory Visit | Attending: Internal Medicine | Admitting: Internal Medicine

## 2024-07-16 DIAGNOSIS — Z1231 Encounter for screening mammogram for malignant neoplasm of breast: Secondary | ICD-10-CM | POA: Diagnosis not present

## 2024-07-23 DIAGNOSIS — I4891 Unspecified atrial fibrillation: Secondary | ICD-10-CM | POA: Diagnosis not present

## 2024-07-23 DIAGNOSIS — I10 Essential (primary) hypertension: Secondary | ICD-10-CM | POA: Diagnosis not present

## 2024-08-04 DIAGNOSIS — I10 Essential (primary) hypertension: Secondary | ICD-10-CM | POA: Diagnosis not present

## 2024-08-04 DIAGNOSIS — E7800 Pure hypercholesterolemia, unspecified: Secondary | ICD-10-CM | POA: Diagnosis not present

## 2024-08-04 DIAGNOSIS — I4891 Unspecified atrial fibrillation: Secondary | ICD-10-CM | POA: Diagnosis not present

## 2024-08-04 DIAGNOSIS — M810 Age-related osteoporosis without current pathological fracture: Secondary | ICD-10-CM | POA: Diagnosis not present

## 2024-09-03 ENCOUNTER — Other Ambulatory Visit

## 2024-09-23 ENCOUNTER — Ambulatory Visit: Payer: Medicare Other

## 2024-09-23 DIAGNOSIS — I4891 Unspecified atrial fibrillation: Secondary | ICD-10-CM | POA: Diagnosis not present

## 2024-09-24 LAB — CUP PACEART REMOTE DEVICE CHECK
Battery Remaining Longevity: 162 mo
Battery Remaining Percentage: 100 %
Brady Statistic RA Percent Paced: 10 %
Brady Statistic RV Percent Paced: 2 %
Date Time Interrogation Session: 20260119061500
Implantable Lead Connection Status: 753985
Implantable Lead Connection Status: 753985
Implantable Lead Implant Date: 20150818
Implantable Lead Implant Date: 20150818
Implantable Lead Location: 753859
Implantable Lead Location: 753860
Implantable Lead Model: 4135
Implantable Lead Model: 4136
Implantable Lead Serial Number: 29563765
Implantable Lead Serial Number: 29591923
Implantable Pulse Generator Implant Date: 20240318
Lead Channel Impedance Value: 487 Ohm
Lead Channel Impedance Value: 549 Ohm
Lead Channel Pacing Threshold Amplitude: 0.5 V
Lead Channel Pacing Threshold Pulse Width: 0.4 ms
Lead Channel Setting Pacing Amplitude: 2 V
Lead Channel Setting Pacing Amplitude: 2 V
Lead Channel Setting Pacing Pulse Width: 0.4 ms
Lead Channel Setting Sensing Sensitivity: 2.5 mV
Pulse Gen Serial Number: 602911
Zone Setting Status: 755011

## 2024-09-26 ENCOUNTER — Other Ambulatory Visit (HOSPITAL_BASED_OUTPATIENT_CLINIC_OR_DEPARTMENT_OTHER)

## 2024-09-27 NOTE — Progress Notes (Signed)
 Remote PPM Transmission

## 2024-09-30 ENCOUNTER — Ambulatory Visit: Payer: Self-pay | Admitting: Cardiology

## 2024-10-14 ENCOUNTER — Ambulatory Visit (HOSPITAL_BASED_OUTPATIENT_CLINIC_OR_DEPARTMENT_OTHER): Admitting: Physical Therapy

## 2024-10-21 ENCOUNTER — Encounter (HOSPITAL_BASED_OUTPATIENT_CLINIC_OR_DEPARTMENT_OTHER): Admitting: Physical Therapy

## 2024-10-28 ENCOUNTER — Encounter (HOSPITAL_BASED_OUTPATIENT_CLINIC_OR_DEPARTMENT_OTHER): Admitting: Physical Therapy

## 2024-11-04 ENCOUNTER — Encounter (HOSPITAL_BASED_OUTPATIENT_CLINIC_OR_DEPARTMENT_OTHER): Admitting: Physical Therapy
